# Patient Record
Sex: Female | Born: 1940 | Race: White | Hispanic: No | Marital: Married | Smoking: Never smoker
Health system: Southern US, Community
[De-identification: ages and names within clinical notes are randomized; demographics above are authoritative.]

## PROBLEM LIST (undated history)

## (undated) DIAGNOSIS — E119 Type 2 diabetes mellitus without complications: Secondary | ICD-10-CM

## (undated) DIAGNOSIS — I10 Essential (primary) hypertension: Secondary | ICD-10-CM

---

## 2004-04-15 ENCOUNTER — Other Ambulatory Visit: Payer: Self-pay

## 2004-12-21 ENCOUNTER — Ambulatory Visit: Payer: Self-pay | Admitting: General Practice

## 2005-01-01 ENCOUNTER — Ambulatory Visit: Payer: Self-pay | Admitting: Unknown Physician Specialty

## 2005-01-06 ENCOUNTER — Ambulatory Visit: Payer: Self-pay | Admitting: Unknown Physician Specialty

## 2005-04-06 ENCOUNTER — Emergency Department: Payer: Self-pay | Admitting: Emergency Medicine

## 2005-06-14 ENCOUNTER — Encounter: Payer: Self-pay | Admitting: Unknown Physician Specialty

## 2005-07-02 ENCOUNTER — Encounter: Payer: Self-pay | Admitting: Unknown Physician Specialty

## 2005-07-14 ENCOUNTER — Ambulatory Visit: Payer: Self-pay | Admitting: Unknown Physician Specialty

## 2005-08-01 ENCOUNTER — Ambulatory Visit: Payer: Self-pay | Admitting: Unknown Physician Specialty

## 2005-08-01 ENCOUNTER — Encounter: Payer: Self-pay | Admitting: Unknown Physician Specialty

## 2005-09-01 ENCOUNTER — Encounter: Payer: Self-pay | Admitting: Unknown Physician Specialty

## 2005-10-01 ENCOUNTER — Encounter: Payer: Self-pay | Admitting: Unknown Physician Specialty

## 2005-11-01 ENCOUNTER — Encounter: Payer: Self-pay | Admitting: Unknown Physician Specialty

## 2005-12-02 ENCOUNTER — Encounter: Payer: Self-pay | Admitting: Unknown Physician Specialty

## 2006-01-12 ENCOUNTER — Ambulatory Visit: Payer: Self-pay

## 2006-04-04 ENCOUNTER — Encounter: Payer: Self-pay | Admitting: Podiatry

## 2006-05-01 ENCOUNTER — Encounter: Payer: Self-pay | Admitting: Podiatry

## 2006-05-22 ENCOUNTER — Emergency Department: Payer: Self-pay | Admitting: Emergency Medicine

## 2007-03-30 ENCOUNTER — Ambulatory Visit: Payer: Self-pay

## 2008-06-18 ENCOUNTER — Ambulatory Visit: Payer: Self-pay | Admitting: Unknown Physician Specialty

## 2009-09-16 ENCOUNTER — Ambulatory Visit: Payer: Self-pay

## 2009-10-16 ENCOUNTER — Ambulatory Visit: Payer: Self-pay | Admitting: Unknown Physician Specialty

## 2011-04-13 ENCOUNTER — Ambulatory Visit: Payer: Self-pay

## 2011-09-30 ENCOUNTER — Ambulatory Visit: Payer: Self-pay | Admitting: Surgery

## 2011-10-19 ENCOUNTER — Ambulatory Visit: Payer: Self-pay | Admitting: Unknown Physician Specialty

## 2012-01-25 ENCOUNTER — Ambulatory Visit: Payer: Self-pay | Admitting: Unknown Physician Specialty

## 2012-07-04 ENCOUNTER — Ambulatory Visit: Payer: Self-pay | Admitting: Orthopedic Surgery

## 2012-08-28 ENCOUNTER — Ambulatory Visit: Payer: Self-pay | Admitting: Unknown Physician Specialty

## 2012-08-31 ENCOUNTER — Ambulatory Visit: Payer: Self-pay | Admitting: Orthopedic Surgery

## 2012-08-31 LAB — BASIC METABOLIC PANEL
Anion Gap: 7 (ref 7–16)
Calcium, Total: 9.4 mg/dL (ref 8.5–10.1)
Co2: 30 mmol/L (ref 21–32)
EGFR (African American): >60
Potassium: 3.9 mmol/L (ref 3.5–5.1)

## 2012-08-31 LAB — CBC
HCT: 34.2 % — ABNORMAL LOW (ref 35.0–47.0)
HGB: 11.4 g/dL — ABNORMAL LOW (ref 12.0–16.0)
RDW: 13.8 % (ref 11.5–14.5)
WBC: 8.3 10*3/uL (ref 3.6–11.0)

## 2012-08-31 LAB — MRSA PCR SCREENING

## 2012-08-31 LAB — PROTIME-INR: INR: 0.9

## 2012-08-31 LAB — SEDIMENTATION RATE: Erythrocyte Sed Rate: 21 mm/hr (ref 0–30)

## 2012-10-03 ENCOUNTER — Inpatient Hospital Stay: Payer: Self-pay | Admitting: Orthopedic Surgery

## 2012-10-04 LAB — HEMOGLOBIN: HGB: 9.5 g/dL — ABNORMAL LOW (ref 12.0–16.0)

## 2012-10-04 LAB — BASIC METABOLIC PANEL
Co2: 25 mmol/L (ref 21–32)
EGFR (African American): >60
EGFR (Non-African Amer.): >60
Glucose: 145 mg/dL — ABNORMAL HIGH (ref 65–99)
Potassium: 3.9 mmol/L (ref 3.5–5.1)
Sodium: 140 mmol/L (ref 136–145)

## 2012-10-18 ENCOUNTER — Encounter: Payer: Self-pay | Admitting: Orthopedic Surgery

## 2012-11-01 ENCOUNTER — Encounter: Payer: Self-pay | Admitting: Orthopedic Surgery

## 2012-12-02 ENCOUNTER — Encounter: Payer: Self-pay | Admitting: Orthopedic Surgery

## 2012-12-30 ENCOUNTER — Encounter: Payer: Self-pay | Admitting: Orthopedic Surgery

## 2013-01-28 IMAGING — CR DG KNEE 1-2V*L*
1 series · 2 of 2 positions shown · non-contrast
Comparison: none

REASON FOR EXAM: LEFT TKR
COMMENTS:

[Series 1: ap · 0.17mm/px · 2 of 2 slices shown]
[im 1/2]
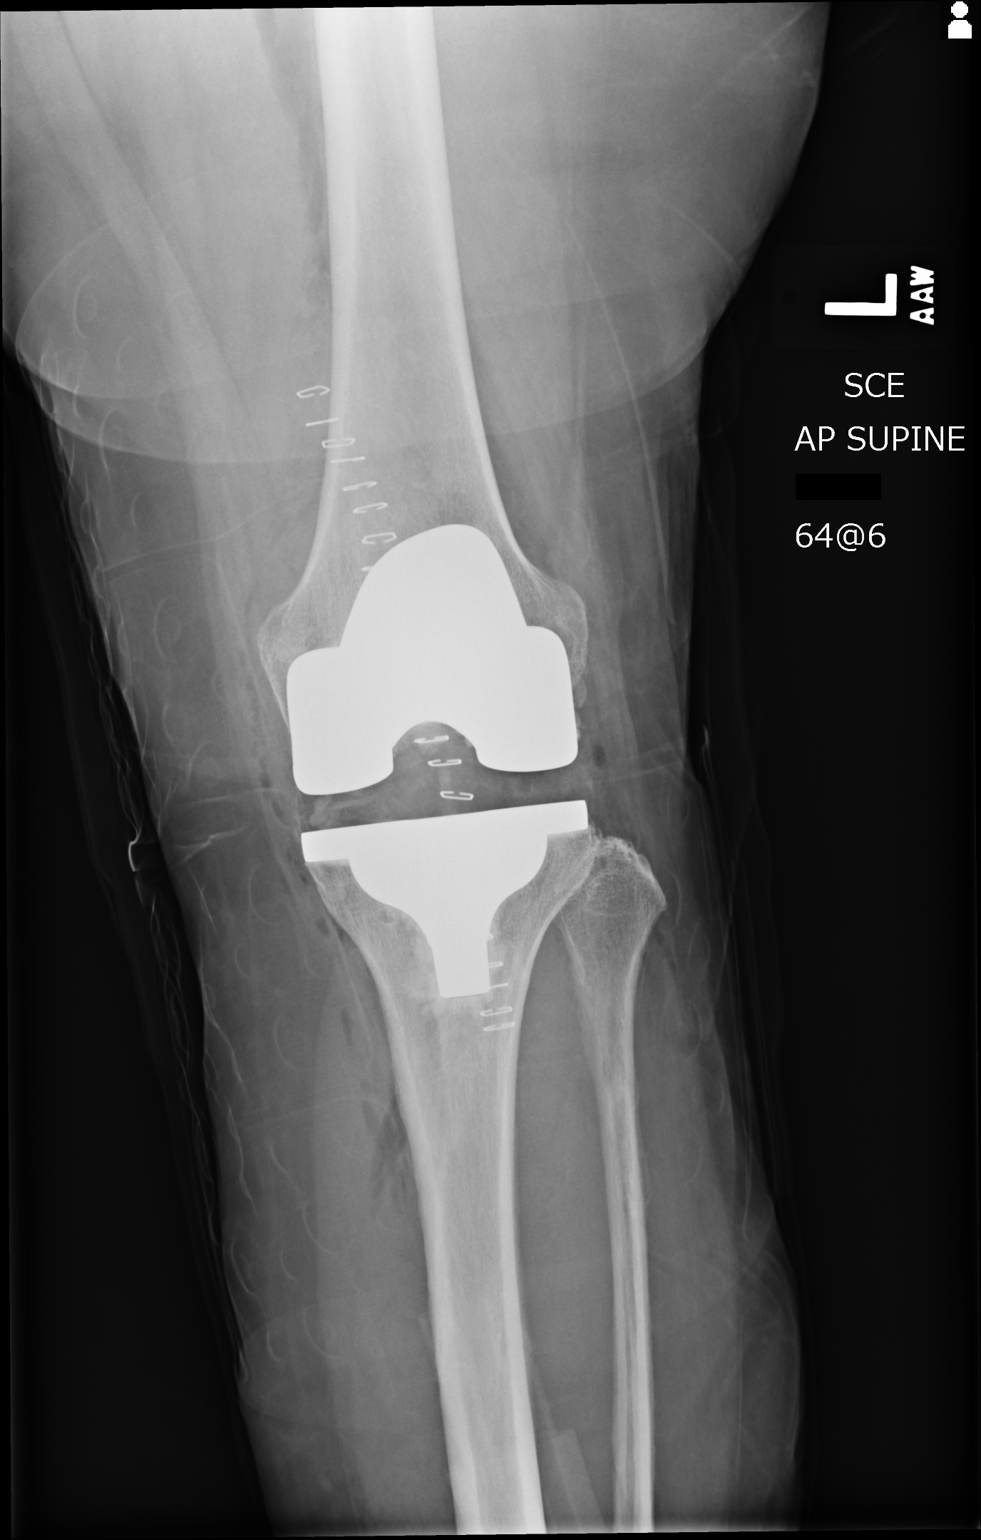
[im 2/2]
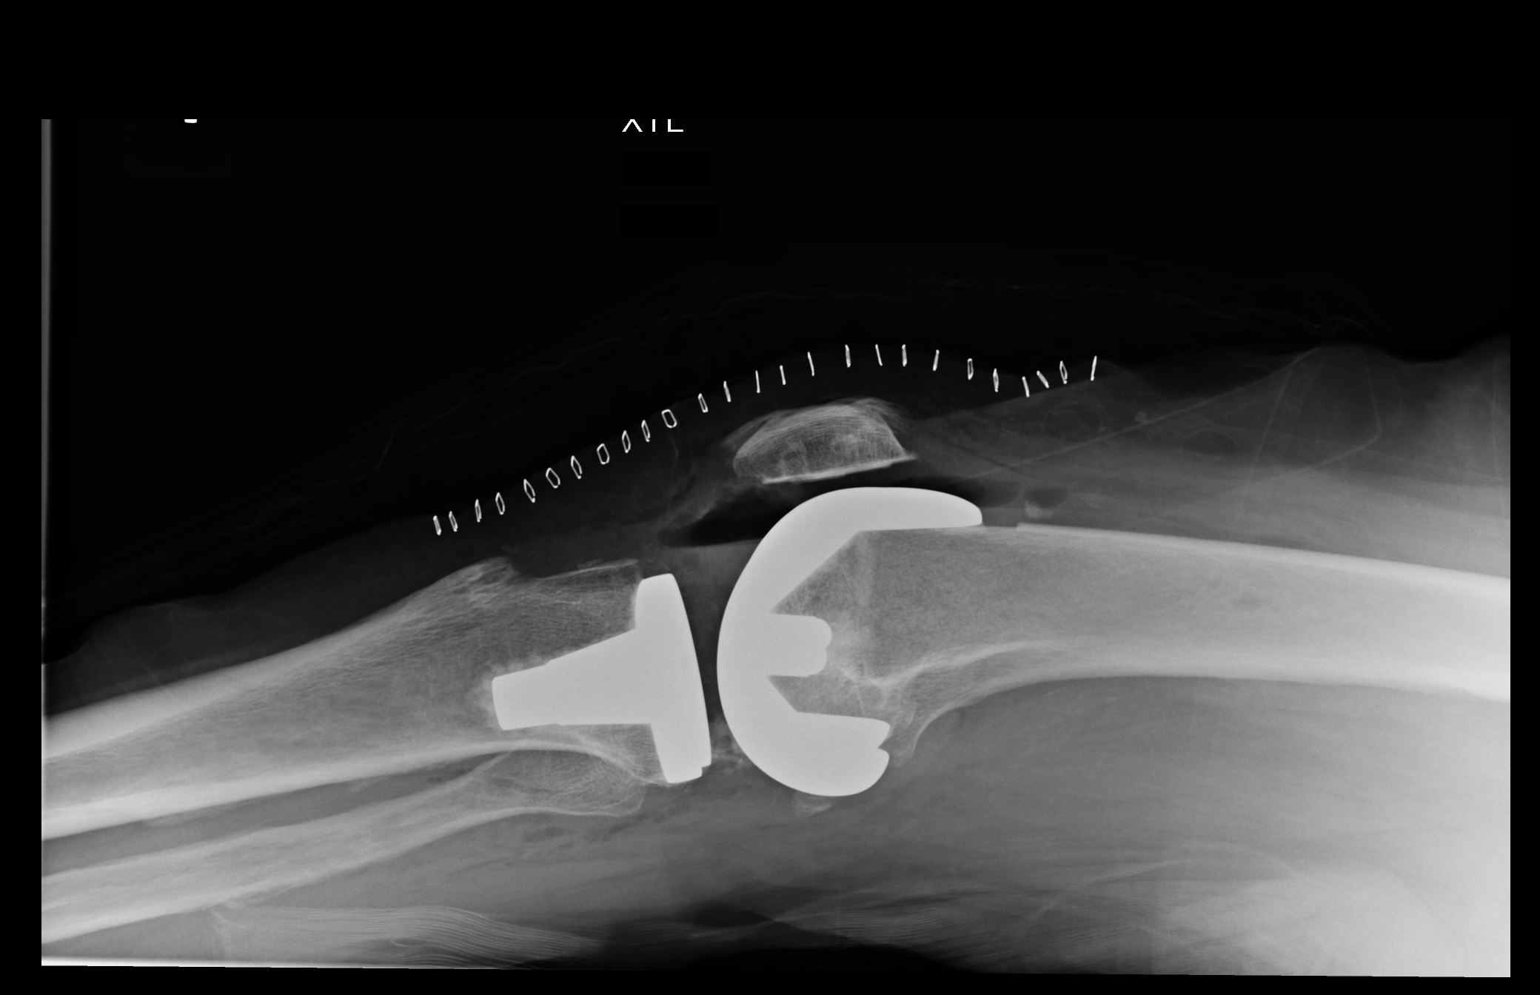

[2 of 2 positions shown; findings below may reference images not displayed]

PROCEDURE:     DXR - DXR KNEE LEFT AP AND LATERAL  - October 03, 2012 [DATE]

RESULT:     AP and lateral views of the left knee reveal the presence of a
total knee joint prosthesis. Radiographic positioning of the prosthetic
components is good. Skin staples are present. There is gas within the soft
tissues.
IMPRESSION: The patient has undergone left total knee joint prosthesis
placement. Further interpretation is deferred to Dr. Eilzer.

[REDACTED]

## 2014-06-05 ENCOUNTER — Emergency Department: Payer: Self-pay | Admitting: Emergency Medicine

## 2014-07-31 DIAGNOSIS — I10 Essential (primary) hypertension: Secondary | ICD-10-CM | POA: Insufficient documentation

## 2014-09-04 ENCOUNTER — Ambulatory Visit: Payer: Self-pay | Admitting: Internal Medicine

## 2014-11-26 DIAGNOSIS — N183 Chronic kidney disease, stage 3 unspecified: Secondary | ICD-10-CM | POA: Insufficient documentation

## 2015-02-18 NOTE — Discharge Summary (Signed)
PATIENT NAME:  Sheena [name], Sheena [name] MR#:  [id number] DATE OF BIRTH:  02-13-1941  DATE OF ADMISSION:  10/03/2012 DATE OF DISCHARGE:  10/07/2012  ADMITTING DIAGNOSIS: Left knee osteoarthritis.   DISCHARGE DIAGNOSIS: Left knee osteoarthritis.   PROCEDURE: Left total knee replacement.   ANESTHESIA: Spinal.   SURGEON: Laurene Footman, M.D.   ASSISTANT: Dorise Hiss, PA-C   IMPLANTS: Medacta left 3 narrow standard femoral component, GMK primary system with a size 2 left fixed tibial component, size 2 14 mm UC tibial insert, and a size 2 resurfacing patella. All components were cemented with antibiotic cement.   ESTIMATED BLOOD LOSS: 50 mL.   COMPLICATIONS: None.   SPECIMEN: Cut ends of bone.   TOURNIQUET TIME: 57 minutes at 300 mmHg.   HISTORY: The patient had increased pain in the left knee that has continued to bother her. If she lays on her side it will awake her. She has more days than none that her pain is debilitating. Whenever she walks on stairs she is quite a bit of pain and in general affects her ability to activities of daily living and care for herself. Despite pain she has continued to work.   PHYSICAL EXAMINATION: On exam she has patellofemoral crepitation as well as medial joint line tenderness. 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 She has intact pulses. Her skin to the knee is normal. She has no pain with hip rotation. There is mild effusion present. Lungs are clear to auscultation. Heart regular rate and rhythm. HEENT is normal.    HOSPITAL COURSE: Patient was admitted to the hospital on 10/03/2012. She had surgery that same day and was brought to the orthopedic floor from the PAC-U in stable condition. On postoperative day one patient's vitals and labs were monitored and her labs remained stable. Throughout her stay she had slow progress with physical therapy but by postoperative day three she did improve dramatically to 90 feet from 15 feet and  by postoperative day four she had increased to 200 feet and was stable and ready for discharge home with home physical therapy.   CONDITION AT DISCHARGE: Stable.   DISCHARGE INSTRUCTIONS:  1. Patient may gradually increase weight-bearing on the affected extremity. She is to elevate the affected foot and leg on 1 or 2 pillows with the foot higher than the knee. She is to wear thigh-high TED hose on both legs and remove at bedtime. Replace when arising the next morning.  2. Diet: She may resume a regular diet. 3. Medications: Lovenox 40 mg subcutaneous once a day for 10. Resume typical home medications.  4. Pain Medications: Oxycodone 5 to 10 mg every four hours as needed for pain, Nucynta 50 to 150 mg every 4 to 6 hours as needed for pain.  5. Wound care: She needs to continue using Polar Care unit maintaining temperature between 40 and 50 degrees. Do not get the dressing or bandage wet or dirty. Call [facility] orthopedics if the dressing gets water under it. Leave the dressing on.  6. Symptoms to report: Call [facility] orthopedics if any of the following occur: Bright red bleeding from the incision or wound, fever above 101.5 degrees, redness, swelling, or drainage at the incision. Call [facility] orthopedics if you experience any increased leg pain, numbness or weakness in the legs or bowel or bladder symptoms.   REFERRALS: Patient is referred to home physical therapy. She needs to call [facility] orthopedics if they have not  contacted her within 48 hours of her return home.   FOLLOW-UP APPOINTMENT: She has a follow-up appointment at [place] in two weeks. She needs to call and schedule an appointment.   DISCHARGE MEDICATIONS:  1. Calcium 600 + D 1 tablet oral once a day in the morning.  2. Fish oil 1000 mg oral capsule one cap orally once a day in the morning.  3. Children's multivitamins 2 tabs orally once a day in the morning. 4. HCTZ valsartan 12.5  mg/160 mg oral tablet 1 tablet orally once a day in the morning.  5. Ferrous sulfate 325 mg 1 tablet orally once a day with lunch.  6. Levothyroxine 88 mcg oral tablet 1 tablet orally once a day in the morning before eating.  7. Metformin 1000 mg oral tablet 1 tablet orally 2 times a day, stopped 48 hours prior to surgery. 8. Celebrex 200 mg oral capsule 1 capsule orally once a day in the morning.  9. Gabapentin 300 mg oral capsule two caps orally once a day at bedtime.  10. Crestor 5 mg oral tablet 1 tablet orally once a day at bedtime. 11. Bydureon 2 mg subcutaneous injection extended-release one dose of subcutaneous once a week on Tuesday. 12. Nucynta 75 mg oral tablet 1 to 2 tabs orally every four hours as needed for pain. 13. Oxycodone 5 mg oral tablet 1 to 2 tabs orally every four hours as needed for pain.  14. Lovenox 40 mg 0.4 mL injection once a day for 14 days.  ____________________________ Duanne Guess, PA-C tcg:cms D: 10/07/2012 09:25:44 ET T: 10/07/2012 10:51:23 ET JOB#: [id number]  cc: Duanne Guess, PA-C, <Dictator> Duanne Guess PA ELECTRONICALLY SIGNED 10/12/2012 10:00

## 2015-02-18 NOTE — Op Note (Signed)
PATIENT NAME:  Sheena [name], Sheena [name] MR#:  [id number] DATE OF BIRTH:  [birth year]  DATE OF PROCEDURE:  10/03/2012  PREOPERATIVE DIAGNOSIS: Left knee osteoarthritis.   POSTOPERATIVE DIAGNOSIS: Left knee osteoarthritis.   PROCEDURE: Left total knee replacement.   ANESTHESIA: Spinal.   SURGEON: Laurene Footman, MD    ASSISTANT: Dorise Hiss, PA-C   IMPLANTS: Medacta left 3 narrow standard femoral component, GMK primary system with a size 2 left fixed tibial component, size 2 14 mm thick UC tibial insert, and a size 2 resurfacing patella. All components were cemented with antibiotic cement.   DESCRIPTION OF PROCEDURE: The patient was brought to the operating room and after adequate anesthesia was obtained, the left leg was prepped and draped in the usual sterile fashion with a tourniquet applied to the upper thigh and Alvarado legholder utilized. After patient identification and time-out procedures were completed and having been prepped and draped in the usual sterile manner, the leg was exsanguinated with an Esmarch and the tourniquet raised to 300 mmHg. Midline skin incision was made followed by a medial parapatellar arthrotomy. Inspection of the knee revealed eburnated bone in the medial femoral condyle and a portion of the tibial condyle. Lateral compartment had areas of full thickness articular loss with exposed bone over approximately a quarter of its surface and patellofemoral joint showing trochlear degenerative change, minimal patellar degenerative change. There was moderate synovitis as well in the joint. The ACL and infrapatellar fat pad were excised and the proximal tibia exposed for application of the [place] cutting guide. After the tibia cutting block was applied, the proximal tibia cut was carried out after checking alignment and bone resections matched the model. The medial and lateral menisci were excised at this time. The femoral cutting block was then applied and drill holes made after  checking alignment and planned resections. This appeared appropriate and the distal femoral cut was carried out. The 3 cutting block was then applied and did not appear that it would notch. Anterior, posterior, and chamfer cuts were carried out and it did appear appropriate at this point. Trial components were inserted with the tibial rotation based on the initial pins off the tibial cutting guide and a size 2 gave excellent coverage. It was pinned in place, proximal drill hole carried out followed by the V notch cut and trial components inserted. With a 14 mm UC insert there was very good stability in flexion and extension and mid flexion as well as to anterior drawer. This was chosen as final component. Distal femoral drill holes were made at this time as well as the notch cut in the trochlea. The patella was then cut using the cutting guide and after drill holes were made it measured to a size 2 patella. At this point the knee was thoroughly irrigated. The posterior capsule and arthrotomy anteriorly were infiltrated with a total of saline, 10 mg morphine, and 30 mL of 0.25% Sensorcaine with epinephrine to aid in postop analgesia. The tibial component was cemented into place first after placing it in position. Excess cement was removed and the polyethylene insert snapped into place. Next, the femoral component was cemented into place further and excess cement again removed. The knee was held in extension as the patellar button was clamped into place. After the cement had set, the patella was noted to track well with no touch technique. After thoroughly irrigating the joint, the tourniquet was let down and hemostasis checked with electrocautery. The arthrotomy was repaired using a heavy  quill suture followed by 2-0 quill subcutaneously and #1 Vicryl, skin staples, Xeroform, 4 x 4's, Webril, Polar Care device, and Ace wrap. The patient was sent to the recovery room in stable condition.   ESTIMATED BLOOD LOSS: 50  mL.   COMPLICATIONS: None.   SPECIMEN: Cut ends of bone.   TOURNIQUET TIME: 57 minutes at 300 mmHg.   ____________________________ Laurene Footman, MD [id number] D: 10/03/2012 20:24:27 ET T: 10/04/2012 08:50:52 ET JOB#: [id number]  cc: Laurene Footman, MD, <Dictator> Laurene Footman MD ELECTRONICALLY SIGNED 10/04/2012 16:41

## 2015-02-25 DIAGNOSIS — E039 Hypothyroidism, unspecified: Secondary | ICD-10-CM | POA: Insufficient documentation

## 2015-02-25 DIAGNOSIS — E7800 Pure hypercholesterolemia, unspecified: Secondary | ICD-10-CM | POA: Insufficient documentation

## 2015-02-26 ENCOUNTER — Ambulatory Visit
Admit: 2015-02-26 | Disposition: A | Discharge status: Home / Self Care | Payer: Self-pay | Attending: Ophthalmology | Admitting: Ophthalmology

## 2015-09-07 DIAGNOSIS — N183 Chronic kidney disease, stage 3 unspecified: Secondary | ICD-10-CM | POA: Insufficient documentation

## 2015-11-24 DIAGNOSIS — E1122 Type 2 diabetes mellitus with diabetic chronic kidney disease: Secondary | ICD-10-CM | POA: Diagnosis not present

## 2015-11-24 DIAGNOSIS — N183 Chronic kidney disease, stage 3 (moderate): Secondary | ICD-10-CM | POA: Diagnosis not present

## 2015-11-24 DIAGNOSIS — E039 Hypothyroidism, unspecified: Secondary | ICD-10-CM | POA: Diagnosis not present

## 2015-12-01 DIAGNOSIS — I10 Essential (primary) hypertension: Secondary | ICD-10-CM | POA: Diagnosis not present

## 2015-12-01 DIAGNOSIS — N182 Chronic kidney disease, stage 2 (mild): Secondary | ICD-10-CM | POA: Diagnosis not present

## 2015-12-01 DIAGNOSIS — E1122 Type 2 diabetes mellitus with diabetic chronic kidney disease: Secondary | ICD-10-CM | POA: Diagnosis not present

## 2015-12-01 DIAGNOSIS — E7800 Pure hypercholesterolemia, unspecified: Secondary | ICD-10-CM | POA: Diagnosis not present

## 2015-12-01 DIAGNOSIS — J0110 Acute frontal sinusitis, unspecified: Secondary | ICD-10-CM | POA: Diagnosis not present

## 2016-02-23 DIAGNOSIS — E1122 Type 2 diabetes mellitus with diabetic chronic kidney disease: Secondary | ICD-10-CM | POA: Diagnosis not present

## 2016-02-23 DIAGNOSIS — N182 Chronic kidney disease, stage 2 (mild): Secondary | ICD-10-CM | POA: Diagnosis not present

## 2016-03-01 DIAGNOSIS — L600 Ingrowing nail: Secondary | ICD-10-CM | POA: Diagnosis not present

## 2016-03-01 DIAGNOSIS — E1122 Type 2 diabetes mellitus with diabetic chronic kidney disease: Secondary | ICD-10-CM | POA: Diagnosis not present

## 2016-03-01 DIAGNOSIS — E039 Hypothyroidism, unspecified: Secondary | ICD-10-CM | POA: Diagnosis not present

## 2016-03-01 DIAGNOSIS — I10 Essential (primary) hypertension: Secondary | ICD-10-CM | POA: Diagnosis not present

## 2016-03-01 DIAGNOSIS — N183 Chronic kidney disease, stage 3 (moderate): Secondary | ICD-10-CM | POA: Diagnosis not present

## 2016-03-01 DIAGNOSIS — Z6835 Body mass index (BMI) 35.0-35.9, adult: Secondary | ICD-10-CM | POA: Diagnosis not present

## 2016-03-05 DIAGNOSIS — L03031 Cellulitis of right toe: Secondary | ICD-10-CM | POA: Diagnosis not present

## 2016-03-23 DIAGNOSIS — L03031 Cellulitis of right toe: Secondary | ICD-10-CM | POA: Diagnosis not present

## 2016-04-08 DIAGNOSIS — L03031 Cellulitis of right toe: Secondary | ICD-10-CM | POA: Diagnosis not present

## 2016-05-26 DIAGNOSIS — E1122 Type 2 diabetes mellitus with diabetic chronic kidney disease: Secondary | ICD-10-CM | POA: Diagnosis not present

## 2016-05-26 DIAGNOSIS — N183 Chronic kidney disease, stage 3 (moderate): Secondary | ICD-10-CM | POA: Diagnosis not present

## 2016-06-02 ENCOUNTER — Other Ambulatory Visit: Payer: Self-pay | Admitting: Internal Medicine

## 2016-06-02 DIAGNOSIS — Z1239 Encounter for other screening for malignant neoplasm of breast: Secondary | ICD-10-CM | POA: Diagnosis not present

## 2016-06-02 DIAGNOSIS — E1122 Type 2 diabetes mellitus with diabetic chronic kidney disease: Secondary | ICD-10-CM | POA: Diagnosis not present

## 2016-06-02 DIAGNOSIS — E039 Hypothyroidism, unspecified: Secondary | ICD-10-CM | POA: Diagnosis not present

## 2016-06-02 DIAGNOSIS — I10 Essential (primary) hypertension: Secondary | ICD-10-CM | POA: Diagnosis not present

## 2016-06-02 DIAGNOSIS — N183 Chronic kidney disease, stage 3 (moderate): Secondary | ICD-10-CM | POA: Diagnosis not present

## 2016-06-23 ENCOUNTER — Ambulatory Visit
Admission: RE | Admit: 2016-06-23 | Discharge: 2016-06-23 | Disposition: A | Discharge status: Home / Self Care | Payer: PPO | Source: Ambulatory Visit | Attending: Internal Medicine | Admitting: Internal Medicine

## 2016-06-23 ENCOUNTER — Other Ambulatory Visit: Payer: Self-pay | Admitting: Internal Medicine

## 2016-06-23 DIAGNOSIS — Z1239 Encounter for other screening for malignant neoplasm of breast: Secondary | ICD-10-CM | POA: Insufficient documentation

## 2016-06-23 DIAGNOSIS — Z1231 Encounter for screening mammogram for malignant neoplasm of breast: Secondary | ICD-10-CM | POA: Insufficient documentation

## 2016-08-26 DIAGNOSIS — E039 Hypothyroidism, unspecified: Secondary | ICD-10-CM | POA: Diagnosis not present

## 2016-08-26 DIAGNOSIS — E1122 Type 2 diabetes mellitus with diabetic chronic kidney disease: Secondary | ICD-10-CM | POA: Diagnosis not present

## 2016-08-26 DIAGNOSIS — N183 Chronic kidney disease, stage 3 (moderate): Secondary | ICD-10-CM | POA: Diagnosis not present

## 2016-09-02 DIAGNOSIS — Z0000 Encounter for general adult medical examination without abnormal findings: Secondary | ICD-10-CM | POA: Diagnosis not present

## 2016-09-02 DIAGNOSIS — Z1211 Encounter for screening for malignant neoplasm of colon: Secondary | ICD-10-CM | POA: Diagnosis not present

## 2016-09-02 DIAGNOSIS — Z780 Asymptomatic menopausal state: Secondary | ICD-10-CM | POA: Diagnosis not present

## 2016-09-08 DIAGNOSIS — Z780 Asymptomatic menopausal state: Secondary | ICD-10-CM | POA: Diagnosis not present

## 2016-12-03 DIAGNOSIS — I10 Essential (primary) hypertension: Secondary | ICD-10-CM | POA: Diagnosis not present

## 2016-12-03 DIAGNOSIS — N183 Chronic kidney disease, stage 3 (moderate): Secondary | ICD-10-CM | POA: Diagnosis not present

## 2016-12-03 DIAGNOSIS — M159 Polyosteoarthritis, unspecified: Secondary | ICD-10-CM | POA: Insufficient documentation

## 2016-12-03 DIAGNOSIS — M150 Primary generalized (osteo)arthritis: Secondary | ICD-10-CM | POA: Diagnosis not present

## 2016-12-03 DIAGNOSIS — E1122 Type 2 diabetes mellitus with diabetic chronic kidney disease: Secondary | ICD-10-CM | POA: Diagnosis not present

## 2016-12-03 DIAGNOSIS — E039 Hypothyroidism, unspecified: Secondary | ICD-10-CM | POA: Diagnosis not present

## 2017-02-25 DIAGNOSIS — N183 Chronic kidney disease, stage 3 (moderate): Secondary | ICD-10-CM | POA: Diagnosis not present

## 2017-02-25 DIAGNOSIS — E1122 Type 2 diabetes mellitus with diabetic chronic kidney disease: Secondary | ICD-10-CM | POA: Diagnosis not present

## 2017-03-04 DIAGNOSIS — N183 Chronic kidney disease, stage 3 (moderate): Secondary | ICD-10-CM | POA: Diagnosis not present

## 2017-03-04 DIAGNOSIS — E538 Deficiency of other specified B group vitamins: Secondary | ICD-10-CM | POA: Diagnosis not present

## 2017-03-04 DIAGNOSIS — E039 Hypothyroidism, unspecified: Secondary | ICD-10-CM | POA: Diagnosis not present

## 2017-03-04 DIAGNOSIS — D649 Anemia, unspecified: Secondary | ICD-10-CM | POA: Diagnosis not present

## 2017-03-04 DIAGNOSIS — L989 Disorder of the skin and subcutaneous tissue, unspecified: Secondary | ICD-10-CM | POA: Diagnosis not present

## 2017-03-04 DIAGNOSIS — E1122 Type 2 diabetes mellitus with diabetic chronic kidney disease: Secondary | ICD-10-CM | POA: Diagnosis not present

## 2017-03-04 DIAGNOSIS — I10 Essential (primary) hypertension: Secondary | ICD-10-CM | POA: Diagnosis not present

## 2017-03-10 ENCOUNTER — Other Ambulatory Visit: Payer: Self-pay | Admitting: Internal Medicine

## 2017-03-10 DIAGNOSIS — N183 Chronic kidney disease, stage 3 unspecified: Secondary | ICD-10-CM

## 2017-03-17 ENCOUNTER — Ambulatory Visit
Admission: RE | Admit: 2017-03-17 | Discharge: 2017-03-17 | Disposition: A | Discharge status: Home / Self Care | Payer: PPO | Source: Ambulatory Visit | Attending: Internal Medicine | Admitting: Internal Medicine

## 2017-03-17 DIAGNOSIS — N189 Chronic kidney disease, unspecified: Secondary | ICD-10-CM | POA: Diagnosis not present

## 2017-03-17 DIAGNOSIS — N183 Chronic kidney disease, stage 3 unspecified: Secondary | ICD-10-CM

## 2017-03-25 DIAGNOSIS — D649 Anemia, unspecified: Secondary | ICD-10-CM | POA: Diagnosis not present

## 2017-06-06 DIAGNOSIS — N183 Chronic kidney disease, stage 3 (moderate): Secondary | ICD-10-CM | POA: Diagnosis not present

## 2017-06-06 DIAGNOSIS — E538 Deficiency of other specified B group vitamins: Secondary | ICD-10-CM | POA: Diagnosis not present

## 2017-06-06 DIAGNOSIS — D649 Anemia, unspecified: Secondary | ICD-10-CM | POA: Diagnosis not present

## 2017-06-06 DIAGNOSIS — E039 Hypothyroidism, unspecified: Secondary | ICD-10-CM | POA: Diagnosis not present

## 2017-06-06 DIAGNOSIS — E1122 Type 2 diabetes mellitus with diabetic chronic kidney disease: Secondary | ICD-10-CM | POA: Diagnosis not present

## 2017-06-13 DIAGNOSIS — E039 Hypothyroidism, unspecified: Secondary | ICD-10-CM | POA: Diagnosis not present

## 2017-06-13 DIAGNOSIS — N183 Chronic kidney disease, stage 3 (moderate): Secondary | ICD-10-CM | POA: Diagnosis not present

## 2017-06-13 DIAGNOSIS — Z0000 Encounter for general adult medical examination without abnormal findings: Secondary | ICD-10-CM | POA: Diagnosis not present

## 2017-06-13 DIAGNOSIS — D649 Anemia, unspecified: Secondary | ICD-10-CM | POA: Diagnosis not present

## 2017-06-13 DIAGNOSIS — E1122 Type 2 diabetes mellitus with diabetic chronic kidney disease: Secondary | ICD-10-CM | POA: Diagnosis not present

## 2017-06-13 DIAGNOSIS — Z23 Encounter for immunization: Secondary | ICD-10-CM | POA: Diagnosis not present

## 2017-06-13 DIAGNOSIS — I10 Essential (primary) hypertension: Secondary | ICD-10-CM | POA: Diagnosis not present

## 2017-12-06 ENCOUNTER — Other Ambulatory Visit: Payer: Self-pay | Admitting: Internal Medicine

## 2017-12-06 DIAGNOSIS — Z1231 Encounter for screening mammogram for malignant neoplasm of breast: Secondary | ICD-10-CM

## 2017-12-07 DIAGNOSIS — D649 Anemia, unspecified: Secondary | ICD-10-CM | POA: Diagnosis not present

## 2017-12-07 DIAGNOSIS — E1122 Type 2 diabetes mellitus with diabetic chronic kidney disease: Secondary | ICD-10-CM | POA: Diagnosis not present

## 2017-12-07 DIAGNOSIS — N183 Chronic kidney disease, stage 3 (moderate): Secondary | ICD-10-CM | POA: Diagnosis not present

## 2017-12-07 DIAGNOSIS — E039 Hypothyroidism, unspecified: Secondary | ICD-10-CM | POA: Diagnosis not present

## 2017-12-14 DIAGNOSIS — I10 Essential (primary) hypertension: Secondary | ICD-10-CM | POA: Diagnosis not present

## 2017-12-14 DIAGNOSIS — D649 Anemia, unspecified: Secondary | ICD-10-CM | POA: Diagnosis not present

## 2017-12-14 DIAGNOSIS — N183 Chronic kidney disease, stage 3 (moderate): Secondary | ICD-10-CM | POA: Diagnosis not present

## 2017-12-14 DIAGNOSIS — E1122 Type 2 diabetes mellitus with diabetic chronic kidney disease: Secondary | ICD-10-CM | POA: Diagnosis not present

## 2017-12-14 DIAGNOSIS — J00 Acute nasopharyngitis [common cold]: Secondary | ICD-10-CM | POA: Diagnosis not present

## 2017-12-19 ENCOUNTER — Ambulatory Visit
Admission: RE | Admit: 2017-12-19 | Discharge: 2017-12-19 | Disposition: A | Discharge status: Home / Self Care | Payer: PPO | Source: Ambulatory Visit | Attending: Internal Medicine | Admitting: Internal Medicine

## 2017-12-19 DIAGNOSIS — Z1231 Encounter for screening mammogram for malignant neoplasm of breast: Secondary | ICD-10-CM | POA: Insufficient documentation

## 2018-03-08 DIAGNOSIS — N183 Chronic kidney disease, stage 3 (moderate): Secondary | ICD-10-CM | POA: Diagnosis not present

## 2018-03-08 DIAGNOSIS — E1122 Type 2 diabetes mellitus with diabetic chronic kidney disease: Secondary | ICD-10-CM | POA: Diagnosis not present

## 2018-03-08 DIAGNOSIS — D649 Anemia, unspecified: Secondary | ICD-10-CM | POA: Diagnosis not present

## 2018-03-15 DIAGNOSIS — E1122 Type 2 diabetes mellitus with diabetic chronic kidney disease: Secondary | ICD-10-CM | POA: Diagnosis not present

## 2018-03-15 DIAGNOSIS — N183 Chronic kidney disease, stage 3 (moderate): Secondary | ICD-10-CM | POA: Diagnosis not present

## 2018-03-15 DIAGNOSIS — D509 Iron deficiency anemia, unspecified: Secondary | ICD-10-CM | POA: Insufficient documentation

## 2018-03-15 DIAGNOSIS — I10 Essential (primary) hypertension: Secondary | ICD-10-CM | POA: Diagnosis not present

## 2018-03-15 DIAGNOSIS — D508 Other iron deficiency anemias: Secondary | ICD-10-CM | POA: Diagnosis not present

## 2018-06-09 DIAGNOSIS — N183 Chronic kidney disease, stage 3 (moderate): Secondary | ICD-10-CM | POA: Diagnosis not present

## 2018-06-09 DIAGNOSIS — D508 Other iron deficiency anemias: Secondary | ICD-10-CM | POA: Diagnosis not present

## 2018-06-09 DIAGNOSIS — E1122 Type 2 diabetes mellitus with diabetic chronic kidney disease: Secondary | ICD-10-CM | POA: Diagnosis not present

## 2018-06-16 DIAGNOSIS — D508 Other iron deficiency anemias: Secondary | ICD-10-CM | POA: Diagnosis not present

## 2018-06-16 DIAGNOSIS — Z0000 Encounter for general adult medical examination without abnormal findings: Secondary | ICD-10-CM | POA: Diagnosis not present

## 2018-06-16 DIAGNOSIS — N183 Chronic kidney disease, stage 3 (moderate): Secondary | ICD-10-CM | POA: Diagnosis not present

## 2018-06-26 DIAGNOSIS — L299 Pruritus, unspecified: Secondary | ICD-10-CM | POA: Diagnosis not present

## 2018-08-08 DIAGNOSIS — L565 Disseminated superficial actinic porokeratosis (DSAP): Secondary | ICD-10-CM | POA: Diagnosis not present

## 2018-08-08 DIAGNOSIS — D0462 Carcinoma in situ of skin of left upper limb, including shoulder: Secondary | ICD-10-CM | POA: Diagnosis not present

## 2018-08-08 DIAGNOSIS — L570 Actinic keratosis: Secondary | ICD-10-CM | POA: Diagnosis not present

## 2018-09-11 DIAGNOSIS — D508 Other iron deficiency anemias: Secondary | ICD-10-CM | POA: Diagnosis not present

## 2018-09-11 DIAGNOSIS — N183 Chronic kidney disease, stage 3 (moderate): Secondary | ICD-10-CM | POA: Diagnosis not present

## 2018-09-18 DIAGNOSIS — D649 Anemia, unspecified: Secondary | ICD-10-CM | POA: Diagnosis not present

## 2018-09-18 DIAGNOSIS — I10 Essential (primary) hypertension: Secondary | ICD-10-CM | POA: Diagnosis not present

## 2018-09-18 DIAGNOSIS — Z23 Encounter for immunization: Secondary | ICD-10-CM | POA: Diagnosis not present

## 2018-09-18 DIAGNOSIS — N183 Chronic kidney disease, stage 3 (moderate): Secondary | ICD-10-CM | POA: Diagnosis not present

## 2018-09-18 DIAGNOSIS — E1122 Type 2 diabetes mellitus with diabetic chronic kidney disease: Secondary | ICD-10-CM | POA: Diagnosis not present

## 2018-11-08 DIAGNOSIS — L97909 Non-pressure chronic ulcer of unspecified part of unspecified lower leg with unspecified severity: Secondary | ICD-10-CM | POA: Diagnosis not present

## 2018-11-14 ENCOUNTER — Encounter: Payer: PPO | Attending: Physician Assistant | Admitting: Physician Assistant

## 2018-11-14 DIAGNOSIS — E1122 Type 2 diabetes mellitus with diabetic chronic kidney disease: Secondary | ICD-10-CM | POA: Insufficient documentation

## 2018-11-14 DIAGNOSIS — L97822 Non-pressure chronic ulcer of other part of left lower leg with fat layer exposed: Secondary | ICD-10-CM | POA: Insufficient documentation

## 2018-11-14 DIAGNOSIS — N183 Chronic kidney disease, stage 3 (moderate): Secondary | ICD-10-CM | POA: Insufficient documentation

## 2018-11-14 DIAGNOSIS — E039 Hypothyroidism, unspecified: Secondary | ICD-10-CM | POA: Diagnosis not present

## 2018-11-14 DIAGNOSIS — I129 Hypertensive chronic kidney disease with stage 1 through stage 4 chronic kidney disease, or unspecified chronic kidney disease: Secondary | ICD-10-CM | POA: Insufficient documentation

## 2018-11-14 DIAGNOSIS — E11622 Type 2 diabetes mellitus with other skin ulcer: Secondary | ICD-10-CM | POA: Insufficient documentation

## 2018-11-14 DIAGNOSIS — E1140 Type 2 diabetes mellitus with diabetic neuropathy, unspecified: Secondary | ICD-10-CM | POA: Insufficient documentation

## 2018-11-15 DIAGNOSIS — E11621 Type 2 diabetes mellitus with foot ulcer: Secondary | ICD-10-CM | POA: Diagnosis not present

## 2018-11-15 NOTE — Progress Notes (Signed)
FINNLEIGH, MARCHETTI ([id number]) Visit Report for 11/14/2018 Allergy List Details Patient Name: Sheena [name], Sheena A. Date of Service: 11/14/2018 8:45 AM Medical Record Number: [id number] Patient Account Number: [id number] Date of Birth/Sex: [birth year] (78 y.o. Female) Treating RN: Cornell Barman Primary Care Brayn Eckstein: Glendon Axe Other Clinician: Referring Sophiya Morello: Campbell Lerner Treating Ashika Apuzzo/Extender: Melburn Hake, HOYT Weeks in Treatment: 0 Allergies Active Allergies No Known Drug Intolerances Allergy Notes Electronic Signature(s) Signed: 11/14/2018 5:06:25 PM By: Gretta Cool, BSN, RN, CWS, Kim RN, BSN Entered By: Gretta Cool, BSN, RN, CWS, Kim on 11/14/2018 09:05:23 Sheena [name] ([id number]) -------------------------------------------------------------------------------- Arrival Information Details Patient Name: Sheena Robert A. Date of Service: 11/14/2018 8:45 AM Medical Record Number: [id number] Patient Account Number: [id number] Date of Birth/Sex: 12/02/1940 (78 y.o. Female) Treating RN: Montey Hora Primary Care Dijon Kohlman: Glendon Axe Other Clinician: Referring Margurete Guaman: Campbell Lerner Treating Johngabriel Verde/Extender: Melburn Hake, HOYT Weeks in Treatment: 0 Visit Information Patient Arrived: Ambulatory Arrival Time: 08:55 Accompanied By: self Transfer Assistance: None Patient Identification Verified: Yes Secondary Verification Process Completed: Yes Electronic Signature(s) Signed: 11/14/2018 1:47:13 PM By: Lorine Bears RCP, RRT, CHT Entered By: Lorine Bears on 11/14/2018 08:55:59 Annett, Floraine AMarland Kitchen ([id number]) -------------------------------------------------------------------------------- Clinic Level of Care Assessment Details Patient Name: Sheena [name], Sheena A. Date of Service: 11/14/2018 8:45 AM Medical Record Number: [id number] Patient Account Number: [id number] Date of Birth/Sex: 08-06-1941 (77 y.o. Female) Treating RN: Montey Hora Primary Care [place] Fant: Glendon Axe Other Clinician: Referring Makana Feigel: Campbell Lerner Treating Brison Fiumara/Extender: STONE III, HOYT Weeks in Treatment: 0 Clinic Level of Care Assessment Items TOOL 1 Quantity Score []  - Use when EandM and Procedure is performed on INITIAL visit 0 ASSESSMENTS - Nursing Assessment / Reassessment X - General Physical Exam (combine w/ comprehensive assessment (listed just below) when 1 20 performed on new pt. evals) X- 1 25 Comprehensive Assessment (HX, ROS, Risk Assessments, Wounds Hx, etc.) ASSESSMENTS - Wound and Skin Assessment / Reassessment []  - Dermatologic / Skin Assessment (not related to wound area) 0 ASSESSMENTS - Ostomy and/or Continence Assessment and Care []  - Incontinence Assessment and Management 0 []  - 0 Ostomy Care Assessment and Management (repouching, etc.) PROCESS - Coordination of Care X - Simple Patient / Family Education for ongoing care 1 15 []  - 0 Complex (extensive) Patient / Family Education for ongoing care X- 1 10 Staff obtains Programmer, systems, Records, Test Results / Process Orders []  - 0 Staff telephones HHA, Nursing Homes / Clarify orders / etc []  - 0 Routine Transfer to another Facility (non-emergent condition) []  - 0 Routine Hospital Admission (non-emergent condition) X- 1 15 New Admissions / Biomedical engineer / Ordering NPWT, Apligraf, etc. []  - 0 Emergency Hospital Admission (emergent condition) PROCESS - Special Needs []  - Pediatric / Minor Patient Management 0 []  - 0 Isolation Patient Management []  - 0 Hearing / Language / Visual special needs []  - 0 Assessment of Community assistance (transportation, D/C planning, etc.) []  - 0 Additional assistance / Altered mentation []  - 0 Support Surface(s) Assessment (bed, cushion, seat, etc.) Sheena [name], Sheena A. ([id number]) INTERVENTIONS - Miscellaneous []  - External ear exam 0 []  - 0 Patient Transfer (multiple staff / Civil Service fast streamer / Similar  devices) []  - 0 Simple Staple / Suture removal (25 or less) []  - 0 Complex Staple / Suture removal (26 or more) []  - 0 Hypo/Hyperglycemic Management (do not check if billed separately) X- 1 15 Ankle / Brachial Index (ABI) - do not check if billed separately Has the patient been seen at the hospital within the last  three years: Yes Total Score: 100 Level Of Care: New/Established - Level 3 Electronic Signature(s) Signed: 11/14/2018 5:22:36 PM By: Montey Hora Entered By: Montey Hora on 11/14/2018 09:38:12 Sheena [name] ([id number]) -------------------------------------------------------------------------------- Encounter Discharge Information Details Patient Name: Sheena Robert A. Date of Service: 11/14/2018 8:45 AM Medical Record Number: [id number] Patient Account Number: [id number] Date of Birth/Sex: Dec 26, 1940 (78 y.o. Female) Treating RN: Montey Hora Primary Care Naveyah Iacovelli: Glendon Axe Other Clinician: Referring Elodie Panameno: Campbell Lerner Treating Sahalie Beth/Extender: STONE III, HOYT Weeks in Treatment: 0 Encounter Discharge Information Items Post Procedure Vitals Discharge Condition: Stable Temperature (F): 98.2 Ambulatory Status: Ambulatory Pulse (bpm): 83 Discharge Destination: Home Respiratory Rate (breaths/min): 18 Transportation: Private Auto Blood Pressure (mmHg): 140/66 Accompanied By: self Schedule Follow-up Appointment: Yes Clinical Summary of Care: Electronic Signature(s) Signed: 11/14/2018 5:22:36 PM By: Montey Hora Entered By: Montey Hora on 11/14/2018 09:46:58 Sheena [name], Sheena A. ([id number]) -------------------------------------------------------------------------------- Lower Extremity Assessment Details Patient Name: Sheena Robert A. Date of Service: 11/14/2018 8:45 AM Medical Record Number: [id number] Patient Account Number: [id number] Date of Birth/Sex: [birth year] (78 y.o. Female) Treating RN: Cornell Barman Primary Care Suhana Wilner:  Glendon Axe Other Clinician: Referring Sandia Pfund: Campbell Lerner Treating Beniah Magnan/Extender: STONE III, HOYT Weeks in Treatment: 0 Vascular Assessment Pulses: Dorsalis Pedis Palpable: [Left:Yes] [Right:Yes] Posterior Tibial Palpable: [Left:Yes] [Right:Yes] Extremity colors, hair growth, and conditions: Extremity Color: [Left:Normal] [Right:Normal] Hair Growth on Extremity: [Left:No] [Right:No] Temperature of Extremity: [Left:Warm] [Right:Warm] Capillary Refill: [Left:> 3 seconds] [Right:> 3 seconds] Blood Pressure: Brachial: [Left:143] Dorsalis Pedis: 160 [Left:Dorsalis Pedis: 145] Ankle: Posterior Tibial: 120 [Left:Posterior Tibial: 178 1.12] [Right:1.24] Toe Nail Assessment Left: Right: Thick: Yes Yes Discolored: No Yes Deformed: No No Improper Length and Hygiene: No No Notes Great right toenail bruised from accident. Electronic Signature(s) Signed: 11/14/2018 5:06:25 PM By: Gretta Cool, BSN, RN, CWS, Kim RN, BSN Entered By: Gretta Cool, BSN, RN, CWS, Kim on 11/14/2018 09:24:43 Sheena [name] ([id number]) -------------------------------------------------------------------------------- Multi Wound Chart Details Patient Name: Sheena Robert A. Date of Service: 11/14/2018 8:45 AM Medical Record Number: [id number] Patient Account Number: [id number] Date of Birth/Sex: 1941-01-11 (78 y.o. Female) Treating RN: Montey Hora Primary Care Kaysin Brock: Glendon Axe Other Clinician: Referring Frazer [place]: Campbell Lerner Treating Kendarrius Tanzi/Extender: STONE III, HOYT Weeks in Treatment: 0 Vital Signs Height(in): 61 Pulse(bpm): 67 Weight(lbs): 201.3 Blood Pressure(mmHg): 140/66 Body Mass Index(BMI): 38 Temperature(F): 98.2 Respiratory Rate 16 (breaths/min): Photos: [1:No Photos] [N/A:N/A] Wound Location: [1:Left Lower Leg - Midline, Anterior] [N/A:N/A] Wounding Event: [1:Surgical Injury] [N/A:N/A] Primary Etiology: [1:Diabetic Wound/Ulcer of the Lower Extremity]  [N/A:N/A] Comorbid History: [1:Cataracts, Anemia, Hypertension, Type II Diabetes, End Stage Renal Disease, Neuropathy] [N/A:N/A] Date Acquired: [1:08/01/2018] [N/A:N/A] Weeks of Treatment: [1:0] [N/A:N/A] Wound Status: [1:Open] [N/A:N/A] Measurements L x W x D [1:1.4x1.5x0.3] [N/A:N/A] (cm) Area (cm) : [1:1.649] [N/A:N/A] Volume (cm) : [1:0.495] [N/A:N/A] Classification: [1:Grade 2] [N/A:N/A] Exudate Amount: [1:Medium] [N/A:N/A] Exudate Type: [1:Serous] [N/A:N/A] Exudate Color: [1:amber] [N/A:N/A] Wound Margin: [1:Thickened] [N/A:N/A] Granulation Amount: [1:Small (1-33%)] [N/A:N/A] Granulation Quality: [1:Red, Hyper-granulation] [N/A:N/A] Necrotic Amount: [1:Large (67-100%)] [N/A:N/A] Exposed Structures: [1:Fat Layer (Subcutaneous Tissue) Exposed: Yes Fascia: No Tendon: No Muscle: No Joint: No Bone: No] [N/A:N/A] Epithelialization: [1:None] [N/A:N/A] Periwound Skin Texture: [1:Excoriation: No Induration: No Callus: No Crepitus: No] [N/A:N/A] Rash: No Scarring: No Periwound Skin Moisture: Maceration: No N/A N/A Dry/Scaly: No Periwound Skin Color: Erythema: Yes N/A N/A Atrophie Blanche: No Cyanosis: No Ecchymosis: No Hemosiderin Staining: No Mottled: No Pallor: No Rubor: No Erythema Location: Circumferential N/A N/A Temperature: No Abnormality N/A N/A Tenderness on Palpation: Yes N/A N/A Wound Preparation: Ulcer Cleansing: N/A  N/A Rinsed/Irrigated with Saline Topical Anesthetic Applied: Other: lidocaine 4% Treatment Notes Electronic Signature(s) Signed: 11/14/2018 5:22:36 PM By: Montey Hora Entered By: Montey Hora on 11/14/2018 09:35:27 Sheena [name] ([id number]) -------------------------------------------------------------------------------- Multi-Disciplinary Care Plan Details Patient Name: Sheena Robert A. Date of Service: 11/14/2018 8:45 AM Medical Record Number: [id number] Patient Account Number: [id number] Date of Birth/Sex: [birth year] (78 y.o.  Female) Treating RN: Montey Hora Primary Care Carvel Huskins: Glendon Axe Other Clinician: Referring Clarity Ciszek: Campbell Lerner Treating Lonette Stevison/Extender: Melburn Hake, HOYT Weeks in Treatment: 0 Active Inactive Nutrition Nursing Diagnoses: Imbalanced nutrition Goals: Patient/caregiver agrees to and verbalizes understanding of need to use nutritional supplements and/or vitamins as prescribed Date Initiated: 11/14/2018 Target Resolution Date: 02/03/2019 Goal Status: Active Interventions: Assess patient nutrition upon admission and as needed per policy Notes: Orientation to the Wound Care Program Nursing Diagnoses: Knowledge deficit related to the wound healing center program Goals: Patient/caregiver will verbalize understanding of the [place] Program Date Initiated: 11/14/2018 Target Resolution Date: 02/03/2019 Goal Status: Active Interventions: Provide education on orientation to the wound center Notes: [name], Acute or Chronic Nursing Diagnoses: [name], acute or chronic: actual or potential Goals: Patient will verbalize adequate [name] control and receive [name] control interventions during procedures as needed Date Initiated: 11/14/2018 Target Resolution Date: 02/03/2019 Goal Status: Active Interventions: Complete [name] assessment as per visit requirements Sheena [name], Sheena A. ([id number]) Notes: Wound/Skin Impairment Nursing Diagnoses: Impaired tissue integrity Goals: Ulcer/skin breakdown will heal within 14 weeks Date Initiated: 11/14/2018 Target Resolution Date: 02/03/2019 Goal Status: Active Interventions: Assess patient/caregiver ability to obtain necessary supplies Assess patient/caregiver ability to perform ulcer/skin care regimen upon admission and as needed Assess ulceration(s) every visit Notes: Electronic Signature(s) Signed: 11/14/2018 5:22:36 PM By: Montey Hora Entered By: Montey Hora on 11/14/2018 09:35:13 Sheena [name], Sheena A.  ([id number]) -------------------------------------------------------------------------------- [name] Assessment Details Patient Name: Sheena Robert A. Date of Service: 11/14/2018 8:45 AM Medical Record Number: [id number] Patient Account Number: [id number] Date of Birth/Sex: [birth year] (78 y.o. Female) Treating RN: Montey Hora Primary Care Elyssia Strausser: Glendon Axe Other Clinician: Referring Omaya Nieland: Campbell Lerner Treating Laveda Demedeiros/Extender: STONE III, HOYT Weeks in Treatment: 0 Active Problems Location of [name] Severity and Description of [name] Patient Has Paino Yes Site Locations Rate the [name]. Current [name] Level: 1 Character of [name] Describe the [name]: Burning, Other: stinging [name] Management and Medication Current [name] Management: Electronic Signature(s) Signed: 11/14/2018 1:47:13 PM By: Paulla Fore, RRT, CHT Signed: 11/14/2018 5:22:36 PM By: Montey Hora Entered By: Lorine Bears on 11/14/2018 08:56:54 Sheena [name] ([id number]) -------------------------------------------------------------------------------- Patient/Caregiver Education Details Patient Name: Sheena Robert A. Date of Service: 11/14/2018 8:45 AM Medical Record Number: [id number] Patient Account Number: [id number] Date of Birth/Gender: 18-Sep-1941 (78 y.o. Female) Treating RN: Montey Hora Primary Care Physician: Glendon Axe Other Clinician: Referring Physician: Campbell Lerner Treating Physician/Extender: Melburn Hake, HOYT Weeks in Treatment: 0 Education Assessment Education Provided To: Patient Education Topics Provided Wound/Skin Impairment: Handouts: Other: wound care as ordered Methods: Demonstration, Explain/Verbal Responses: State content correctly Electronic Signature(s) Signed: 11/14/2018 5:22:36 PM By: Montey Hora Entered By: Montey Hora on 11/14/2018 09:38:30 Sheena [name], Sheena A.  ([id number]) -------------------------------------------------------------------------------- Wound Assessment Details Patient Name: Sheena Robert A. Date of Service: 11/14/2018 8:45 AM Medical Record Number: [id number] Patient Account Number: [id number] Date of Birth/Sex: 1940/12/28 (78 y.o. Female) Treating RN: Cornell Barman Primary Care Alick Lecomte: Glendon Axe Other Clinician: Referring Giovanna Kemmerer: Campbell Lerner Treating Auriah Hollings/Extender: STONE III, HOYT Weeks in Treatment: 0 Wound Status Wound Number: 1 Primary Diabetic Wound/Ulcer of the Lower Extremity Etiology: Wound Location: Left  Lower Leg - Midline, Anterior Wound Open Wounding Event: Surgical Injury Status: Date Acquired: 08/01/2018 Comorbid Cataracts, Anemia, Hypertension, Type II Weeks Of Treatment: 0 History: Diabetes, End Stage Renal Disease, Clustered Wound: No Neuropathy Photos Photo Uploaded By: Gretta Cool, BSN, RN, CWS, Kim on 11/14/2018 17:00:14 Wound Measurements Length: (cm) 1.4 Width: (cm) 1.5 Depth: (cm) 0.3 Area: (cm) 1.649 Volume: (cm) 0.495 % Reduction in Area: % Reduction in Volume: Epithelialization: None Tunneling: No Undermining: No Wound Description Classification: Grade 2 Wound Margin: Thickened Exudate Amount: Medium Exudate Type: Serous Exudate Color: amber Foul Odor After Cleansing: No Slough/Fibrino Yes Wound Bed Granulation Amount: Small (1-33%) Exposed Structure Granulation Quality: Red, Hyper-granulation Fascia Exposed: No Necrotic Amount: Large (67-100%) Fat Layer (Subcutaneous Tissue) Exposed: Yes Necrotic Quality: Adherent Slough Tendon Exposed: No Muscle Exposed: No Joint Exposed: No Bone Exposed: No Periwound Skin Texture Sheena [name], Sheena A. ([id number]) Texture Color No Abnormalities Noted: Yes No Abnormalities Noted: No Atrophie Blanche: No Moisture Cyanosis: No No Abnormalities Noted: Yes Ecchymosis: No Erythema: Yes Erythema Location:  Circumferential Hemosiderin Staining: No Mottled: No Pallor: No Rubor: No Temperature / [name] Temperature: No Abnormality Tenderness on Palpation: Yes Wound Preparation Ulcer Cleansing: Rinsed/Irrigated with Saline Topical Anesthetic Applied: Other: lidocaine 4%, Treatment Notes Wound #1 (Left, Midline, Anterior Lower Leg) Notes prisma, bordered foam dressing Electronic Signature(s) Signed: 11/14/2018 5:06:25 PM By: Gretta Cool, BSN, RN, CWS, Kim RN, BSN Entered By: Gretta Cool, BSN, RN, CWS, Kim on 11/14/2018 09:18:02 Sheena [name] ([id number]) -------------------------------------------------------------------------------- Vitals Details Patient Name: Sheena Robert A. Date of Service: 11/14/2018 8:45 AM Medical Record Number: [id number] Patient Account Number: [id number] Date of Birth/Sex: 06/03/1941 (78 y.o. Female) Treating RN: Montey Hora Primary Care Lilith Solana: Glendon Axe Other Clinician: Referring Debria Broecker: Campbell Lerner Treating Kenleigh Toback/Extender: STONE III, HOYT Weeks in Treatment: 0 Vital Signs Time Taken: 08:56 Temperature (F): 98.2 Height (in): 61 Pulse (bpm): 83 Source: Stated Respiratory Rate (breaths/min): 16 Weight (lbs): 201.3 Blood Pressure (mmHg): 140/66 Source: Measured Reference Range: 80 - 120 mg / dl Body Mass Index (BMI): 38 Electronic Signature(s) Signed: 11/14/2018 1:47:13 PM By: Lorine Bears RCP, RRT, CHT Entered By: Lorine Bears on 11/14/2018 09:00:55

## 2018-11-15 NOTE — Progress Notes (Signed)
Sheena, [name] ([id number]) Visit Report for 11/14/2018 Chief Complaint Document Details Patient Name: Sheena [name], Sheena A. Date of Service: 11/14/2018 8:45 AM Medical Record Number: [id number] Patient Account Number: [id number] Date of Birth/Sex: June 01, 1941 (78 y.o. Female) Treating RN: Montey Hora Primary Care Provider: Glendon Axe Other Clinician: Referring Provider: Campbell Lerner Treating Provider/Extender: Melburn Hake, Mazin Emma Weeks in Treatment: 0 Information Obtained from: Patient Chief Complaint Left leg ulcer Electronic Signature(s) Signed: 11/14/2018 5:17:02 PM By: Worthy Keeler PA-C Entered By: Worthy Keeler on 11/14/2018 09:31:31 Wolaver, Hind A. ([id number]) -------------------------------------------------------------------------------- Debridement Details Patient Name: Sheena Robert A. Date of Service: 11/14/2018 8:45 AM Medical Record Number: [id number] Patient Account Number: [id number] Date of Birth/Sex: April 03, 1941 (78 y.o. Female) Treating RN: Montey Hora Primary Care Provider: Glendon Axe Other Clinician: Referring Provider: Campbell Lerner Treating Provider/Extender: STONE III, Dovid Bartko Weeks in Treatment: 0 Debridement Performed for Wound #1 Left,Midline,Anterior Lower Leg Assessment: Performed By: Physician STONE III, Jace Fermin E., PA-C Debridement Type: Debridement Severity of Tissue Pre Fat layer exposed Debridement: Level of Consciousness (Pre- Awake and Alert procedure): Pre-procedure Verification/Time Yes - 09:36 Out Taken: Start Time: 09:36 Pain Control: Lidocaine 4% Topical Solution Total Area Debrided (L x W): 1.4 (cm) x 1.5 (cm) = 2.1 (cm) Tissue and other material Viable, Non-Viable, Slough, Subcutaneous, Biofilm, Slough debrided: Level: Skin/Subcutaneous Tissue Debridement Description: Excisional Instrument: Curette Bleeding: Minimum Hemostasis Achieved: Pressure End Time: 09:39 Procedural Pain: 0 Post Procedural Pain:  0 Response to Treatment: Procedure was tolerated well Level of Consciousness Awake and Alert (Post-procedure): Post Debridement Measurements of Total Wound Length: (cm) 1.4 Width: (cm) 1.5 Depth: (cm) 0.4 Volume: (cm) 0.66 Character of Wound/Ulcer Post Debridement: Improved Severity of Tissue Post Debridement: Fat layer exposed Post Procedure Diagnosis Same as Pre-procedure Electronic Signature(s) Signed: 11/14/2018 5:17:02 PM By: Worthy Keeler PA-C Signed: 11/14/2018 5:22:36 PM By: Montey Hora Entered By: Montey Hora on 11/14/2018 09:39:29 Claud, Nikkol A. ([id number]) -------------------------------------------------------------------------------- HPI Details Patient Name: Sheena Robert A. Date of Service: 11/14/2018 8:45 AM Medical Record Number: [id number] Patient Account Number: [id number] Date of Birth/Sex: 03-25-1941 (78 y.o. Female) Treating RN: Montey Hora Primary Care Provider: Glendon Axe Other Clinician: Referring Provider: Campbell Lerner Treating Provider/Extender: Melburn Hake, Jessalynn Mccowan Weeks in Treatment: 0 History of Present Illness HPI Description: 11/14/18 on evaluation today patient presents for initial evaluation our clinic concerning issues that she is having with her left lower extremity secondary to a biopsy which was performed on August 01, 2018. This area come back as an actinic keratosis and was not cancerous. With that being said she's had a very difficult time getting this we can heal although she states and last month it has been somewhat better than previous. Fortunately there does not appear to be any signs of infection. She did have recommendations from the dermatologist to do vinegar soaks twice a day along with applying Bactroban following. She is a diabetic type II with her most recent reported hemoglobin A1c of 6.7 this is from the patient we did not have record of this. She also has hypertension and chronic kidney disease stage III  along with hypothyroidism. No fevers, chills, nausea, or vomiting noted at this time. Electronic Signature(s) Signed: 11/14/2018 5:17:02 PM By: Worthy Keeler PA-C Entered By: Worthy Keeler on 11/14/2018 09:46:54 Jerline Pain ([id number]) -------------------------------------------------------------------------------- Physical Exam Details Patient Name: Sheena Robert A. Date of Service: 11/14/2018 8:45 AM Medical Record Number: [id number] Patient Account Number: [id number] Date of Birth/Sex: December 25, 1940 (78 y.o. Female) Treating RN: Montey Hora Primary  Care Provider: Glendon Axe Other Clinician: Referring Provider: Campbell Lerner Treating Provider/Extender: STONE III, Ronav Furney Weeks in Treatment: 0 Constitutional patient is hypertensive.. pulse regular and within target range for patient.Marland Kitchen respirations regular, non-labored and within target range for patient.Marland Kitchen temperature within target range for patient.. Well-nourished and well-hydrated in no acute distress. Eyes conjunctiva clear no eyelid edema noted. pupils equal round and reactive to light and accommodation. Ears, Nose, Mouth, and Throat no gross abnormality of ear auricles or external auditory canals. normal hearing noted during conversation. mucus membranes moist. Respiratory normal breathing without difficulty. clear to auscultation bilaterally. Cardiovascular regular rate and rhythm with normal S1, S2. 2+ dorsalis pedis/posterior tibialis pulses. no clubbing, cyanosis, significant edema, <3 sec cap refill. Gastrointestinal (GI) soft, non-tender, non-distended, +BS. no ventral hernia noted. Musculoskeletal normal gait and posture. no significant deformity or arthritic changes, no loss or range of motion, no clubbing. Psychiatric this patient is able to make decisions and demonstrates good insight into disease process. Alert and Oriented x 3. pleasant and cooperative. Notes On evaluation today patient's wound  bed actually shows evidence of fairly good granulation at this point in time. There is some Slough noted on the surface of the wound which did require sharp debridement today. She tolerated this without complication. Post debridement the wound bed seems to be doing significantly better which is great news. Overall I've been I am rather pleased with the appearance today. Electronic Signature(s) Signed: 11/14/2018 5:17:02 PM By: Worthy Keeler PA-C Entered By: Worthy Keeler on 11/14/2018 09:47:42 Jerline Pain ([id number]) -------------------------------------------------------------------------------- Physician Orders Details Patient Name: Sheena Robert A. Date of Service: 11/14/2018 8:45 AM Medical Record Number: [id number] Patient Account Number: [id number] Date of Birth/Sex: [birth year] (78 y.o. Female) Treating RN: Montey Hora Primary Care Provider: Glendon Axe Other Clinician: Referring Provider: Campbell Lerner Treating Provider/Extender: Melburn Hake, Mohit Zirbes Weeks in Treatment: 0 Verbal / Phone Orders: No Diagnosis Coding ICD-10 Coding Code Description E11.622 Type 2 diabetes mellitus with other skin ulcer L97.822 Non-pressure chronic ulcer of other part of left lower leg with fat layer exposed I10 Essential (primary) hypertension N18.3 Chronic kidney disease, stage 3 (moderate) Wound Cleansing Wound #1 Left,Midline,Anterior Lower Leg o Clean wound with Normal Saline. o May Shower, gently pat wound dry prior to applying new dressing. Anesthetic (add to Medication List) Wound #1 Left,Midline,Anterior Lower Leg o Topical Lidocaine 4% cream applied to wound bed prior to debridement (In Clinic Only). Primary Wound Dressing Wound #1 Left,Midline,Anterior Lower Leg o Silver Collagen - moisten with saline Secondary Dressing Wound #1 Left,Midline,Anterior Lower Leg o Boardered Foam Dressing Dressing Change Frequency Wound #1 Left,Midline,Anterior Lower  Leg o Change dressing every other day. Follow-up Appointments Wound #1 Left,Midline,Anterior Lower Leg o Return Appointment in 1 week. Electronic Signature(s) Signed: 11/14/2018 5:17:02 PM By: Worthy Keeler PA-C Signed: 11/14/2018 5:22:36 PM By: Montey Hora Entered By: Montey Hora on 11/14/2018 09:40:23 Jerline Pain ([id number]) -------------------------------------------------------------------------------- Problem List Details Patient Name: Sheena Robert A. Date of Service: 11/14/2018 8:45 AM Medical Record Number: [id number] Patient Account Number: [id number] Date of Birth/Sex: [birth year] (78 y.o. Female) Treating RN: Montey Hora Primary Care Provider: Glendon Axe Other Clinician: Referring Provider: Campbell Lerner Treating Provider/Extender: Melburn Hake, Trenita Hulme Weeks in Treatment: 0 Active Problems ICD-10 Evaluated Encounter Code Description Active Date Today Diagnosis E11.622 Type 2 diabetes mellitus with other skin ulcer 11/14/2018 No Yes L97.822 Non-pressure chronic ulcer of other part of left lower leg with 11/14/2018 No Yes fat layer exposed I10 Essential (primary) hypertension 11/14/2018  No Yes N18.3 Chronic kidney disease, stage 3 (moderate) 11/14/2018 No Yes Inactive Problems Resolved Problems Electronic Signature(s) Signed: 11/14/2018 5:17:02 PM By: Worthy Keeler PA-C Entered By: Worthy Keeler on 11/14/2018 09:30:31 Santori, [place] A. ([id number]) -------------------------------------------------------------------------------- Progress Note Details Patient Name: Sheena Robert A. Date of Service: 11/14/2018 8:45 AM Medical Record Number: [id number] Patient Account Number: [id number] Date of Birth/Sex: Mar 09, 1941 (78 y.o. Female) Treating RN: Montey Hora Primary Care Provider: Glendon Axe Other Clinician: Referring Provider: Campbell Lerner Treating Provider/Extender: Melburn Hake, Yari Szeliga Weeks in Treatment: 0 Subjective Chief  Complaint Information obtained from Patient Left leg ulcer History of Present Illness (HPI) 11/14/18 on evaluation today patient presents for initial evaluation our clinic concerning issues that she is having with her left lower extremity secondary to a biopsy which was performed on August 01, 2018. This area come back as an actinic keratosis and was not cancerous. With that being said she's had a very difficult time getting this we can heal although she states and last month it has been somewhat better than previous. Fortunately there does not appear to be any signs of infection. She did have recommendations from the dermatologist to do vinegar soaks twice a day along with applying Bactroban following. She is a diabetic type II with her most recent reported hemoglobin A1c of 6.7 this is from the patient we did not have record of this. She also has hypertension and chronic kidney disease stage III along with hypothyroidism. No fevers, chills, nausea, or vomiting noted at this time. Wound History Patient presents with 1 open wound that has been present for approximately october. Patient has been treating wound in the following manner: Mupirocin. Laboratory tests have not been performed in the last month. Patient reportedly has not tested positive for an antibiotic resistant organism. Patient reportedly has not tested positive for osteomyelitis. Patient reportedly has not had testing performed to evaluate circulation in the legs. Patient History Information obtained from Patient, Chart. Allergies No Known Drug Intolerances Family History Diabetes - Paternal Grandparents, Hypertension - Father, Stroke - Father, No family history of Cancer, Heart Disease, Hereditary Spherocytosis, Kidney Disease, Lung Disease, Seizures, Thyroid Problems, Tuberculosis. Social History Never smoker, Marital Status - Married, Alcohol Use - Never, Drug Use - No History, Caffeine Use - Moderate. Medical  History Eyes Patient has history of Cataracts - Both removed Denies history of Glaucoma Ear/Nose/Mouth/Throat Denies history of Chronic sinus problems/congestion, Middle ear problems Hematologic/Lymphatic Patient has history of Anemia Denies history of Hemophilia, Human Immunodeficiency Virus, Lymphedema, Sickle Cell Disease Respiratory Koman, Awanda A. ([id number]) Denies history of Aspiration, Asthma, Chronic Obstructive Pulmonary Disease (COPD), Pneumothorax, Sleep Apnea, Tuberculosis Cardiovascular Patient has history of Hypertension - Medication controlled Denies history of Angina, Arrhythmia, Congestive Heart Failure, Coronary Artery Disease, Deep Vein Thrombosis, Hypotension, Myocardial Infarction, Peripheral Arterial Disease, Peripheral Venous Disease, Phlebitis, Vasculitis Gastrointestinal Denies history of Cirrhosis , Colitis, Crohn s, Hepatitis A, Hepatitis B, Hepatitis C Endocrine Patient has history of Type II Diabetes Denies history of Type I Diabetes Genitourinary 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 Blood sugar is not tested. Review of  Systems (ROS) Constitutional Symptoms (General Health) The patient has no complaints or symptoms. Eyes The patient has no complaints or symptoms. Ear/Nose/Mouth/Throat The patient has no complaints or symptoms. Hematologic/Lymphatic The patient has no complaints or  symptoms. Respiratory The patient has no complaints or symptoms. Cardiovascular Complains or has symptoms of LE edema. Denies complaints or symptoms of Chest pain. Gastrointestinal The patient has no complaints or symptoms. Endocrine The patient has no complaints or symptoms. Genitourinary Complains or has symptoms of Incontinence/dribbling. Denies complaints or symptoms of Kidney failure/ Dialysis. Immunological Complains or has symptoms of Itching - Legs. Integumentary (Skin) Complains or has symptoms of Wounds, Bleeding or bruising tendency. Denies complaints or symptoms of Breakdown, Swelling. Musculoskeletal The patient has no complaints or symptoms, Knee replacement Neurologic The patient has no complaints or symptoms. Oncologic JESSICAH, CROLL A. ([id number]) The patient has no complaints or symptoms, Skin Cancer Psychiatric Complains or has symptoms of Claustrophobia. Denies complaints or symptoms of Anxiety. Objective Constitutional patient is hypertensive.. pulse regular and within target range for patient.Marland Kitchen respirations regular, non-labored and within target range for patient.Marland Kitchen temperature within target range for patient.. Well-nourished and well-hydrated in no acute distress. Vitals Time Taken: 8:56 AM, Height: 61 in, Source: Stated, Weight: 201.3 lbs, Source: Measured, BMI: 38, Temperature: 98.2 F, Pulse: 83 bpm, Respiratory Rate: 16 breaths/min, Blood Pressure: 140/66 mmHg. Eyes conjunctiva clear no eyelid edema noted. pupils equal round and reactive to light and accommodation. Ears, Nose, Mouth, and Throat no gross abnormality of ear auricles or external auditory canals. normal hearing noted during conversation. mucus membranes moist. Respiratory normal breathing without difficulty. clear to auscultation bilaterally. Cardiovascular regular rate and rhythm with normal S1, S2. 2+ dorsalis pedis/posterior tibialis pulses. no clubbing, cyanosis,  significant edema, Gastrointestinal (GI) soft, non-tender, non-distended, +BS. no ventral hernia noted. Musculoskeletal normal gait and posture. no significant deformity or arthritic changes, no loss or range of motion, no clubbing. Psychiatric this patient is able to make decisions and demonstrates good insight into disease process. Alert and Oriented x 3. pleasant and cooperative. General Notes: On evaluation today patient's wound bed actually shows evidence of fairly good granulation at this point in time. There is some Slough noted on the surface of the wound which did require sharp debridement today. She tolerated this without complication. Post debridement the wound bed seems to be doing significantly better which is great news. Overall I've been I am rather pleased with the appearance today. Integumentary (Hair, Skin) Wound #1 status is Open. Original cause of wound was Surgical Injury. The wound is located on the Left,Midline,Anterior Lower Leg. The wound measures 1.4cm length x 1.5cm width x 0.3cm depth; 1.649cm^2 area and 0.495cm^3 volume. There is Fat Layer (Subcutaneous Tissue) Exposed exposed. There is no tunneling or undermining noted. There is a medium amount of serous drainage noted. The wound margin is thickened. There is small (1-33%) red, hyper - granulation within the wound bed. There is a large (67-100%) amount of necrotic tissue within the wound bed including Adherent Slough. The periwound skin appearance had no abnormalities noted for texture. The periwound skin appearance had no abnormalities Leinbach, Klee A. ([id number]) noted for moisture. The periwound skin appearance exhibited: Erythema. The periwound skin appearance did not exhibit: Atrophie Blanche, Cyanosis, Ecchymosis, Hemosiderin Staining, Mottled, Pallor, Rubor. The surrounding wound skin color is noted with erythema which is circumferential. Periwound temperature was noted as No Abnormality. The periwound  has tenderness on palpation. Assessment Active Problems ICD-10 Type 2 diabetes mellitus with other skin ulcer  Non-pressure chronic ulcer of other part of left lower leg with fat layer exposed Essential (primary) hypertension Chronic kidney disease, stage 3 (moderate) Procedures Wound #1 Pre-procedure diagnosis of Wound #1 is a Diabetic Wound/Ulcer of the Lower Extremity located on the Left,Midline,Anterior Lower Leg .Severity of Tissue Pre Debridement is: Fat layer exposed. There was a Excisional Skin/Subcutaneous Tissue Debridement with a total area of 2.1 sq cm performed by STONE III, Bijon Mineer E., PA-C. With the following instrument(s): Curette to remove Viable and Non-Viable tissue/material. Material removed includes Subcutaneous Tissue, Slough, and Biofilm after achieving pain control using Lidocaine 4% Topical Solution. No specimens were taken. A time out was conducted at 09:36, prior to the start of the procedure. A Minimum amount of bleeding was controlled with Pressure. The procedure was tolerated well with a pain level of 0 throughout and a pain level of 0 following the procedure. Post Debridement Measurements: 1.4cm length x 1.5cm width x 0.4cm depth; 0.66cm^3 volume. Character of Wound/Ulcer Post Debridement is improved. Severity of Tissue Post Debridement is: Fat layer exposed. Post procedure Diagnosis Wound #1: Same as Pre-Procedure Plan Wound Cleansing: Wound #1 Left,Midline,Anterior Lower Leg: Clean wound with Normal Saline. May Shower, gently pat wound dry prior to applying new dressing. Anesthetic (add to Medication List): Wound #1 Left,Midline,Anterior Lower Leg: Topical Lidocaine 4% cream applied to wound bed prior to debridement (In Clinic Only). Primary Wound Dressing: Wound #1 Left,Midline,Anterior Lower Leg: Silver Collagen - moisten with saline Secondary Dressing: Wound #1 Left,Midline,Anterior Lower Leg: Boardered Foam Dressing Dressing Change  Frequency: Scarpelli, Arnelle A. ([id number]) Wound #1 Left,Midline,Anterior Lower Leg: Change dressing every other day. Follow-up Appointments: Wound #1 Left,Midline,Anterior Lower Leg: Return Appointment in 1 week. I'm gonna suggest currently that we go ahead and continue over the next week with the above wound care measures. The patient is in agreement the plan. With that being said I will have a discontinue use of the vinegar soaks as well as the Bactroban ointment at this point. If anything changes or worsens she will contact the office and let me know otherwise will see her back in one week for reevaluation. The patient is in agreement with plan and tells me that she already has doll antibacterial soap at home which is excellent. Please see above for specific wound care orders. We will see patient for re-evaluation in 1 week(s) here in the clinic. If anything worsens or changes patient will contact our office for additional recommendations. Electronic Signature(s) Signed: 11/14/2018 5:17:02 PM By: Worthy Keeler PA-C Entered By: Worthy Keeler on 11/14/2018 09:48:23 Jerline Pain ([id number]) -------------------------------------------------------------------------------- ROS/PFSH Details Patient Name: Sheena Robert A. Date of Service: 11/14/2018 8:45 AM Medical Record Number: [id number] Patient Account Number: [id number] Date of Birth/Sex: 09/04/1941 (78 y.o. Female) Treating RN: Cornell Barman Primary Care Provider: Glendon Axe Other Clinician: Referring Provider: Campbell Lerner Treating Provider/Extender: Melburn Hake, Jaxan Michel Weeks in Treatment: 0 Information Obtained From Patient Chart Wound History Do you currently have one or more open woundso Yes How many open wounds do you currently haveo 1 Approximately how long have you had your woundso october How have you been treating your wound(s) until nowo Mupirocin Has your wound(s) ever healed and then re-openedo No Have  you had any lab work done in the past montho No Have you tested positive for an antibiotic resistant organism (MRSA, VRE)o No Have you tested positive for osteomyelitis (bone infection)o No Have you had any tests for circulation on your legso No Cardiovascular Complaints and Symptoms:  Positive for: LE edema Negative for: Chest pain Medical History: Positive for: Hypertension - Medication controlled Negative for: Angina; Arrhythmia; Congestive Heart Failure; Coronary Artery Disease; Deep Vein Thrombosis; Hypotension; Myocardial Infarction; Peripheral Arterial Disease; Peripheral Venous Disease; Phlebitis; Vasculitis Genitourinary Complaints and Symptoms: Positive for: Incontinence/dribbling Negative for: Kidney failure/ Dialysis Medical History: Positive for: End Stage Renal Disease - Stage 3 Immunological Complaints and Symptoms: Positive for: Itching - Legs Medical History: Negative for: Lupus Erythematosus; Raynaudos; Scleroderma Integumentary (Skin) Complaints and Symptoms: Positive for: Wounds; Bleeding or bruising tendency Negative for: Breakdown; Swelling Medical HistoryLYDIA, TOREN A. ([id number]) Negative for: History of Burn; History of pressure wounds Psychiatric Complaints and Symptoms: Positive for: Claustrophobia Negative for: Anxiety Medical History: Negative for: Anorexia/bulimia; Confinement Anxiety Constitutional Symptoms (General Health) Complaints and Symptoms: No Complaints or Symptoms Eyes Complaints and Symptoms: No Complaints or Symptoms Medical History: Positive for: Cataracts - Both removed Negative for: Glaucoma Ear/Nose/Mouth/Throat Complaints and Symptoms: No Complaints or Symptoms Medical History: Negative for: Chronic sinus problems/congestion; Middle ear problems Hematologic/Lymphatic Complaints and Symptoms: No Complaints or Symptoms Medical History: Positive for: Anemia Negative for: Hemophilia; Human Immunodeficiency Virus;  Lymphedema; Sickle Cell Disease Respiratory Complaints and Symptoms: No Complaints or Symptoms Medical History: Negative for: Aspiration; Asthma; Chronic Obstructive Pulmonary Disease (COPD); Pneumothorax; Sleep Apnea; Tuberculosis Gastrointestinal Complaints and Symptoms: No Complaints or Symptoms Medical History: Negative for: Cirrhosis ; Colitis; Crohnos; Hepatitis A; Hepatitis B; Hepatitis C Angelucci, Gayathri A. ([id number]) Endocrine Complaints and Symptoms: No Complaints or Symptoms Medical History: Positive for: Type II Diabetes Negative for: Type I Diabetes Time with diabetes: 20 Treated with: Oral agents Blood sugar tested every day: No Musculoskeletal Complaints and Symptoms: No Complaints or Symptoms Complaints and Symptoms: Review of System Notes: Knee replacement Medical History: Negative for: Gout; Rheumatoid Arthritis; Osteoarthritis; Osteomyelitis Neurologic Complaints and Symptoms: No Complaints or Symptoms Medical History: Positive for: Neuropathy Negative for: Dementia; Quadriplegia; Paraplegia; Seizure Disorder Oncologic Complaints and Symptoms: No Complaints or Symptoms Complaints and Symptoms: Review of System Notes: Skin Cancer Medical History: Negative for: Received Chemotherapy; Received Radiation HBO Extended History Items Eyes: Cataracts Immunizations Pneumococcal Vaccine: Received Pneumococcal Vaccination: Yes Implantable Devices Family and Social History Cancer: No; Diabetes: Yes - Paternal Grandparents; Heart Disease: No; Hereditary Spherocytosis: No; Hypertension: Yes - Father; Kidney Disease: No; Lung Disease: No; Seizures: No; Stroke: Yes - Father; Thyroid Problems: No; Tuberculosis: No; Munley, Maliyah A. ([id number]) Never smoker; Marital Status - Married; Alcohol Use: Never; Drug Use: No History; Caffeine Use: Moderate; Advanced Directives: No; Patient does not want information on Advanced Directives; Do not resuscitate: No;  Living Will: No; Medical Power of Attorney: No Electronic Signature(s) Signed: 11/14/2018 5:06:25 PM By: Gretta Cool, BSN, RN, CWS, Kim RN, BSN Signed: 11/14/2018 5:17:02 PM By: Worthy Keeler PA-C Entered By: Gretta Cool, BSN, RN, CWS, Kim on 11/14/2018 09:13:13 Jerline Pain ([id number]) -------------------------------------------------------------------------------- SuperBill Details Patient Name: Sheena Robert A. Date of Service: 11/14/2018 Medical Record Number: [id number] Patient Account Number: [id number] Date of Birth/Sex: 08-09-1941 (78 y.o. 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 I10 Essential (primary) hypertension N18.3 Chronic kidney disease, stage 3 (moderate) Facility Procedures CPT4 Code Description: [id number] 99213 - WOUND CARE VISIT-LEV 3 EST PT Modifier: Quantity: 1 CPT4 Code Description:  [id number] 11042 - DEB SUBQ TISSUE 20 SQ CM/< ICD-10 Diagnosis Description L97.822 Non-pressure chronic ulcer of other part of left lower leg with Modifier: fat layer expos Quantity: 1 ed Physician Procedures CPT4 Code Description: 0100712 WC PHYS LEVEL 3 o NEW PT ICD-10 Diagnosis Description 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

## 2018-11-15 NOTE — Progress Notes (Signed)
Sheena [name], Sheena [name] ([id number]) Visit Report for 11/14/2018 Abuse/Suicide Risk Screen Details Patient Name: [name], [name] Date of Service: 11/14/2018 8:45 AM Medical Record Number: [id number] Patient Account Number: [id number] Date of Birth/Sex: [birth year] (78 y.o. Female) Treating RN: Cornell Barman Primary Care Tiffini Blacksher: Glendon Axe Other Clinician: Referring Samira Acero: Campbell Lerner Treating Rasmus Preusser/Extender: STONE III, HOYT Weeks in Treatment: 0 Abuse/Suicide Risk Screen Items Answer ABUSE/SUICIDE RISK SCREEN: Has anyone close to you tried to hurt or harm you recentlyo No Do you feel uncomfortable with anyone in your familyo No Has anyone forced you do things that you didnot want to doo No Do you have any thoughts of harming yourselfo No Patient displays signs or symptoms of abuse and/or neglect. No Electronic Signature(s) Signed: 11/14/2018 5:06:25 PM By: Gretta Cool, BSN, RN, CWS, Kim RN, BSN Entered By: Gretta Cool, BSN, RN, CWS, Kim on 11/14/2018 09:13:30 Jerline Pain ([id number]) -------------------------------------------------------------------------------- Activities of Daily Living Details Patient Name: Sheena Robert A. Date of Service: 11/14/2018 8:45 AM Medical Record Number: [id number] Patient Account Number: [id number] Date of Birth/Sex: 07/11/1941 (78 y.o. Female) Treating RN: Cornell Barman Primary Care Tayelor Osborne: Glendon Axe Other Clinician: Referring Dupree Givler: Campbell Lerner Treating Dan Dissinger/Extender: Melburn Hake, HOYT Weeks in Treatment: 0 Activities of Daily Living Items Answer Activities of Daily Living (Please select one for each item) Drive Automobile Completely Able Take Medications Completely Able Use Telephone Completely Able Care for Appearance Completely Able Use Toilet Completely Able Bath / Shower Completely Able Dress Self Completely Able Feed Self Completely Able Walk Completely Able Get In / Out Bed Completely Able Housework Completely  Able Prepare Meals Completely Able Handle Money Completely Able Shop for Self Completely Able Electronic Signature(s) Signed: 11/14/2018 5:06:25 PM By: Gretta Cool, BSN, RN, CWS, Kim RN, BSN Entered By: Gretta Cool, BSN, RN, CWS, Kim on 11/14/2018 09:13:38 Jerline Pain ([id number]) -------------------------------------------------------------------------------- Education Assessment Details Patient Name: Sheena Robert A. Date of Service: 11/14/2018 8:45 AM Medical Record Number: [id number] Patient Account Number: [id number] Date of Birth/Sex: 08-Jun-1941 (78 y.o. Female) Treating RN: Cornell Barman Primary Care Kemonie Cutillo: Glendon Axe Other Clinician: Referring Gaylia Kassel: Campbell Lerner Treating Annette Liotta/Extender: Melburn Hake, HOYT Weeks in Treatment: 0 Primary Learner Assessed: Patient Learning Preferences/Education Level/Primary Language Learning Preference: Explanation Highest Education Level: College or Above Preferred Language: English Cognitive Barrier Assessment/Beliefs Language Barrier: No Translator Needed: No Memory Deficit: No Emotional Barrier: No Cultural/Religious Beliefs Affecting Medical Care: No Physical Barrier Assessment Impaired Vision: No Impaired Hearing: Yes Left ear Decreased Hand dexterity: No Knowledge/Comprehension Assessment Knowledge Level: High Comprehension Level: High Ability to understand written High instructions: Ability to understand verbal High instructions: Motivation Assessment Anxiety Level: Calm Cooperation: Cooperative Education Importance: Acknowledges Need Interest in Health Problems: Asks Questions Perception: Coherent Willingness to Engage in Self- High Management Activities: Readiness to Engage in Self- High Management Activities: Electronic Signature(s) Signed: 11/14/2018 5:06:25 PM By: Gretta Cool, BSN, RN, CWS, Kim RN, BSN Entered By: Gretta Cool, BSN, RN, CWS, Kim on 11/14/2018 09:14:09 Jerline Pain  ([id number]) -------------------------------------------------------------------------------- Fall Risk Assessment Details Patient Name: Sheena Robert A. Date of Service: 11/14/2018 8:45 AM Medical Record Number: [id number] Patient Account Number: [id number] Date of Birth/Sex: 01/24/1941 (78 y.o. Female) Treating RN: Cornell Barman Primary Care Kion Huntsberry: Glendon Axe Other Clinician: Referring Sanoe Hazan: Campbell Lerner Treating Cydni Reddoch/Extender: Melburn Hake, HOYT Weeks in Treatment: 0 Fall Risk Assessment Items Have you had 2 or more falls in the last 12 monthso 0 No Have you had any fall that resulted in injury in the last 12 monthso 0 No FALL RISK ASSESSMENT:  History of falling - immediate or within 3 months 0 No Secondary diagnosis 0 No Ambulatory aid None/bed rest/wheelchair/nurse 0 Yes Crutches/cane/walker 0 No Furniture 0 No IV Access/Saline Lock 0 No Gait/Training Normal/bed rest/immobile 0 Yes Weak 0 No Impaired 0 No Mental Status Oriented to own ability 0 Yes Electronic Signature(s) Signed: 11/14/2018 5:06:25 PM By: Gretta Cool, BSN, RN, CWS, Kim RN, BSN Entered By: Gretta Cool, BSN, RN, CWS, Kim on 11/14/2018 09:14:32 Jerline Pain ([id number]) -------------------------------------------------------------------------------- Foot Assessment Details Patient Name: Sheena Robert A. Date of Service: 11/14/2018 8:45 AM Medical Record Number: [id number] Patient Account Number: [id number] Date of Birth/Sex: 06/28/1941 (78 y.o. Female) Treating RN: Cornell Barman Primary Care Loyalty Brashier: Glendon Axe Other Clinician: Referring Cherry Wittwer: Campbell Lerner Treating Ermel Verne/Extender: STONE III, HOYT Weeks in Treatment: 0 Foot Assessment Items Site Locations + = Sensation present, - = Sensation absent, C = Callus, U = Ulcer R = Redness, W = Warmth, M = Maceration, PU = Pre-ulcerative lesion F = Fissure, S = Swelling, D = Dryness Assessment Right: Left: Other Deformity: No No Prior  Foot Ulcer: No No Prior Amputation: No No Charcot Joint: No No Ambulatory Status: Ambulatory Without Help Gait: Steady Electronic Signature(s) Signed: 11/14/2018 5:06:25 PM By: Gretta Cool, BSN, RN, CWS, Kim RN, BSN Entered By: Gretta Cool, BSN, RN, CWS, Kim on 11/14/2018 09:15:30 Jerline Pain ([id number]) -------------------------------------------------------------------------------- Nutrition Risk Assessment Details Patient Name: Sheena Robert A. Date of Service: 11/14/2018 8:45 AM Medical Record Number: [id number] Patient Account Number: [id number] Date of Birth/Sex: Jul 27, 1941 (78 y.o. Female) Treating RN: Cornell Barman Primary Care Kiowa Hollar: Glendon Axe Other Clinician: Referring Elonna Mcfarlane: Campbell Lerner Treating Marabeth Melland/Extender: STONE III, HOYT Weeks in Treatment: 0 Height (in): 61 Weight (lbs): 201.3 Body Mass Index (BMI): 38 Nutrition Risk Assessment Items NUTRITION RISK SCREEN: I have an illness or condition that made me change the kind and/or amount of 0 No food I eat I eat fewer than two meals per day 0 No I eat few fruits and vegetables, or milk products 0 No I have three or more drinks of beer, liquor or wine almost every day 0 No I have tooth or mouth problems that make it hard for me to eat 0 No I don't always have enough money to buy the food I need 0 No I eat alone most of the time 0 No I take three or more different prescribed or over-the-counter drugs a day 1 Yes Without wanting to, I have lost or gained 10 pounds in the last six months 0 No I am not always physically able to shop, cook and/or feed myself 0 No Nutrition Protocols Good Risk Protocol 0 No interventions needed Moderate Risk Protocol Electronic Signature(s) Signed: 11/14/2018 5:06:25 PM By: Gretta Cool, BSN, RN, CWS, Kim RN, BSN Entered By: Gretta Cool, BSN, RN, CWS, Kim on 11/14/2018 [place]

## 2018-11-21 ENCOUNTER — Encounter: Payer: PPO | Admitting: Physician Assistant

## 2018-11-21 DIAGNOSIS — E11622 Type 2 diabetes mellitus with other skin ulcer: Secondary | ICD-10-CM | POA: Diagnosis not present

## 2018-11-21 DIAGNOSIS — T8189XA Other complications of procedures, not elsewhere classified, initial encounter: Secondary | ICD-10-CM | POA: Diagnosis not present

## 2018-11-21 DIAGNOSIS — L97822 Non-pressure chronic ulcer of other part of left lower leg with fat layer exposed: Secondary | ICD-10-CM | POA: Diagnosis not present

## 2018-11-23 NOTE — Progress Notes (Signed)
EMNET, MONK ([id number]) Visit Report for 11/21/2018 Arrival Information Details Patient Name: Sheena [name], Sheena A. Date of Service: 11/21/2018 9:45 AM Medical Record Number: [id number] Patient Account Number: [id number] Date of Birth/Sex: [birth year] (78 y.o. F) Treating RN: Montey Hora Primary Care Edoardo Laforte: Glendon Axe Other Clinician: Referring Ibtisam Benge: Glendon Axe Treating Reeder Brisby/Extender: STONE III, HOYT Weeks in Treatment: 1 Visit Information History Since Last Visit Added or deleted any medications: No Patient Arrived: Ambulatory Any new allergies or adverse reactions: No Arrival Time: 09:51 Had a fall or experienced change in No Accompanied By: self activities of daily living that may affect Transfer Assistance: None risk of falls: Patient Identification Verified: Yes Signs or symptoms of abuse/neglect since last visito No Secondary Verification Process Completed: Yes Hospitalized since last visit: No Implantable device outside of the clinic excluding No cellular tissue based products placed in the center since last visit: Has Dressing in Place as Prescribed: Yes [name] Present Now: No Electronic Signature(s) Signed: 11/21/2018 4:33:45 PM By: Lorine Bears RCP, RRT, CHT Entered By: Becky Sax, Amado Nash on 11/21/2018 09:52:10 Wheatley, Retina A. ([id number]) -------------------------------------------------------------------------------- Compression Therapy Details Patient Name: Sheena Robert A. Date of Service: 11/21/2018 9:45 AM Medical Record Number: [id number] Patient Account Number: [id number] Date of Birth/Sex: Sep 13, 1941 (78 y.o. F) Treating RN: Montey Hora Primary Care Paul Torpey: Glendon Axe Other Clinician: Referring Gunther Zawadzki: Glendon Axe Treating Khamora Karan/Extender: STONE III, HOYT Weeks in Treatment: 1 Compression Therapy Performed for Wound Assessment: Wound #1 Left,Midline,Anterior Lower Leg Performed By:  Clinician Montey Hora, RN Compression Type: Three Layer Pre Treatment ABI: 1.1 Post Procedure Diagnosis Same as Pre-procedure Electronic Signature(s) Signed: 11/21/2018 5:03:48 PM By: Montey Hora Entered By: Montey Hora on 11/21/2018 10:18:54 Sheena [name] ([id number]) -------------------------------------------------------------------------------- Encounter Discharge Information Details Patient Name: Sheena Robert A. Date of Service: 11/21/2018 9:45 AM Medical Record Number: [id number] Patient Account Number: [id number] Date of Birth/Sex: [birth year] (78 y.o. F) Treating RN: Montey Hora Primary Care Lexander Tremblay: Glendon Axe Other Clinician: Referring Nickolaos Brallier: Glendon Axe Treating Cinque Begley/Extender: Melburn Hake, HOYT Weeks in Treatment: 1 Encounter Discharge Information Items Discharge Condition: Stable Ambulatory Status: Ambulatory Discharge Destination: Home Transportation: Private Auto Accompanied By: self Schedule Follow-up Appointment: Yes Clinical Summary of Care: Electronic Signature(s) Signed: 11/21/2018 5:03:48 PM By: Montey Hora Entered By: Montey Hora on 11/21/2018 10:21:03 Sheena [name] ([id number]) -------------------------------------------------------------------------------- Lower Extremity Assessment Details Patient Name: Sheena Robert A. Date of Service: 11/21/2018 9:45 AM Medical Record Number: [id number] Patient Account Number: [id number] Date of Birth/Sex: [birth year] (78 y.o. F) Treating RN: Secundino Ginger Primary Care Bessy Reaney: Glendon Axe Other Clinician: Referring Kemia Wendel: Glendon Axe Treating Kamaile Zachow/Extender: STONE III, HOYT Weeks in Treatment: 1 Edema Assessment Assessed: [Left: No] [Right: No] [Left: Edema] [Right: :] Calf Left: Right: Point of Measurement: 29 cm From Medial Instep 37.4 cm cm Ankle Left: Right: Point of Measurement: 14 cm From Medial Instep 24 cm cm Vascular  Assessment Claudication: Claudication Assessment [Left:None] Pulses: Dorsalis Pedis Palpable: [Left:Yes] Posterior Tibial Extremity colors, hair growth, and conditions: Extremity Color: [Left:Normal] Hair Growth on Extremity: [Left:No] Temperature of Extremity: [Left:Cool] Capillary Refill: [Left:< 3 seconds] Toe Nail Assessment Left: Right: Thick: Yes Discolored: Yes Deformed: No Improper Length and Hygiene: No Electronic Signature(s) Signed: 11/21/2018 3:42:40 PM By: Secundino Ginger Entered By: Secundino Ginger on 11/21/2018 10:03:37 Sheena Robert A. ([id number]) -------------------------------------------------------------------------------- Multi Wound Chart Details Patient Name: Sheena Robert A. Date of Service: 11/21/2018 9:45 AM Medical Record Number: [id number] Patient Account Number: [id number] Date of Birth/Sex: Mar 05, 1941 (78 y.o. F) Treating RN: Montey Hora Primary Care  Renton Berkley: Glendon Axe Other Clinician: Referring Archimedes Harold: Glendon Axe Treating Li Bobo/Extender: STONE III, HOYT Weeks in Treatment: 1 Vital Signs Height(in): 61 Pulse(bpm): 73 Weight(lbs): 201.3 Blood Pressure(mmHg): 149/58 Body Mass Index(BMI): 38 Temperature(F): 97.7 Respiratory Rate 16 (breaths/min): Photos: [1:No Photos] [N/A:N/A] Wound Location: [1:Left Lower Leg - Midline, Anterior] [N/A:N/A] Wounding Event: [1:Surgical Injury] [N/A:N/A] Primary Etiology: [1:Diabetic Wound/Ulcer of the Lower Extremity] [N/A:N/A] Comorbid History: [1:Cataracts, Anemia, Hypertension, Type II Diabetes, End Stage Renal Disease, Neuropathy] [N/A:N/A] Date Acquired: [1:08/01/2018] [N/A:N/A] Weeks of Treatment: [1:1] [N/A:N/A] Wound Status: [1:Open] [N/A:N/A] Measurements L x W x D [1:0.9x0.9x0.3] [N/A:N/A] (cm) Area (cm) : [1:0.636] [N/A:N/A] Volume (cm) : [1:0.191] [N/A:N/A] % Reduction in Area: [1:61.40%] [N/A:N/A] % Reduction in Volume: [1:61.40%] [N/A:N/A] Classification: [1:Grade 2]  [N/A:N/A] Exudate Amount: [1:Small] [N/A:N/A] Exudate Type: [1:Serous] [N/A:N/A] Exudate Color: [1:amber] [N/A:N/A] Wound Margin: [1:Thickened] [N/A:N/A] Granulation Amount: [1:None Present (0%)] [N/A:N/A] Necrotic Amount: [1:Large (67-100%)] [N/A:N/A] Exposed Structures: [1:Fat Layer (Subcutaneous Tissue) Exposed: Yes Fascia: No Tendon: No Muscle: No Joint: No Bone: No] [N/A:N/A] Epithelialization: [1:None] [N/A:N/A] Periwound Skin Texture: [1:Excoriation: No Induration: No Callus: No] [N/A:N/A] Crepitus: No Rash: No Scarring: No Periwound Skin Moisture: Maceration: No N/A N/A Dry/Scaly: No Periwound Skin Color: Erythema: Yes N/A N/A Atrophie Blanche: No Cyanosis: No Ecchymosis: No Hemosiderin Staining: No Mottled: No Pallor: No Rubor: No Erythema Location: Circumferential N/A N/A Temperature: No Abnormality N/A N/A Tenderness on Palpation: Yes N/A N/A Wound Preparation: Ulcer Cleansing: N/A N/A Rinsed/Irrigated with Saline Topical Anesthetic Applied: Other: lidocaine 4% Treatment Notes Electronic Signature(s) Signed: 11/21/2018 5:03:48 PM By: Montey Hora Entered By: Montey Hora on 11/21/2018 10:14:28 Sheena [name] ([id number]) -------------------------------------------------------------------------------- Multi-Disciplinary Care Plan Details Patient Name: Sheena Robert A. Date of Service: 11/21/2018 9:45 AM Medical Record Number: [id number] Patient Account Number: [id number] Date of Birth/Sex: 1940-11-26 (78 y.o. F) Treating RN: Montey Hora Primary Care Keishia Ground: Glendon Axe Other Clinician: Referring Harpreet Signore: Glendon Axe Treating Lyrik Buresh/Extender: STONE III, HOYT Weeks in Treatment: 1 Active Inactive Nutrition Nursing Diagnoses: Imbalanced nutrition Goals: Patient/caregiver agrees to and verbalizes understanding of need to use nutritional supplements and/or vitamins as prescribed Date Initiated: 11/14/2018 Target Resolution Date:  02/03/2019 Goal Status: Active Interventions: Assess patient nutrition upon admission and as needed per policy Notes: Orientation to the Wound Care Program Nursing Diagnoses: Knowledge deficit related to the wound healing center program Goals: Patient/caregiver will verbalize understanding of the [facility] Date Initiated: 11/14/2018 Target Resolution Date: 02/03/2019 Goal Status: Active Interventions: Provide education on orientation to the wound center Notes: [name], Acute or Chronic Nursing Diagnoses: [name], acute or chronic: actual or potential Goals: Patient will verbalize adequate [name] control and receive [name] control interventions during procedures as needed Date Initiated: 11/14/2018 Target Resolution Date: 02/03/2019 Goal Status: Active Interventions: Complete [name] assessment as per visit requirements Bohne, Lariza A. ([id number]) Notes: Wound/Skin Impairment Nursing Diagnoses: Impaired tissue integrity Goals: Ulcer/skin breakdown will heal within 14 weeks Date Initiated: 11/14/2018 Target Resolution Date: 02/03/2019 Goal Status: Active Interventions: Assess patient/caregiver ability to obtain necessary supplies Assess patient/caregiver ability to perform ulcer/skin care regimen upon admission and as needed Assess ulceration(s) every visit Notes: Electronic Signature(s) Signed: 11/21/2018 5:03:48 PM By: Montey Hora Entered By: Montey Hora on 11/21/2018 10:14:18 Chastain, Gowri A. ([id number]) -------------------------------------------------------------------------------- [name] Assessment Details Patient Name: Sheena Robert A. Date of Service: 11/21/2018 9:45 AM Medical Record Number: [id number] Patient Account Number: [id number] Date of Birth/Sex: Mar 09, 1941 (78 y.o. F) Treating RN: Montey Hora Primary Care Karess Harner: Glendon Axe Other Clinician: Referring Rocco Kerkhoff: Glendon Axe Treating Stelios Kirby/Extender: Joaquim Lai  III, HOYT Weeks in  Treatment: 1 Active Problems Location of [name] Severity and Description of [name] Patient Has Paino No Site Locations [name] Management and Medication Current [name] Management: Electronic Signature(s) Signed: 11/21/2018 4:33:45 PM By: Paulla Fore, RRT, CHT Signed: 11/21/2018 5:03:48 PM By: Montey Hora Entered By: Lorine Bears on 11/21/2018 09:52:16 Sheena [name] ([id number]) -------------------------------------------------------------------------------- Patient/Caregiver Education Details Patient Name: Sheena Robert A. Date of Service: 11/21/2018 9:45 AM Medical Record Number: [id number] Patient Account Number: [id number] Date of Birth/Gender: [birth year] (78 y.o. F) Treating RN: Montey Hora Primary Care Physician: Glendon Axe Other Clinician: Referring Physician: Glendon Axe Treating Physician/Extender: Melburn Hake, HOYT Weeks in Treatment: 1 Education Assessment Education Provided To: Patient Education Topics Provided Venous: Handouts: Other: purpose of compression wraps and precautions/reportable s/s Methods: Explain/Verbal Responses: State content correctly Electronic Signature(s) Signed: 11/21/2018 5:03:48 PM By: Montey Hora Entered By: Montey Hora on 11/21/2018 10:17:00 Sheena Robert A. ([id number]) -------------------------------------------------------------------------------- Wound Assessment Details Patient Name: Sheena Robert A. Date of Service: 11/21/2018 9:45 AM Medical Record Number: [id number] Patient Account Number: [id number] Date of Birth/Sex: 07-18-1941 (78 y.o. F) Treating RN: Secundino Ginger Primary Care Lorenso Quirino: Glendon Axe Other Clinician: Referring Veronda Gabor: Glendon Axe Treating Hopelynn Gartland/Extender: STONE III, HOYT Weeks in Treatment: 1 Wound Status Wound Number: 1 Primary Diabetic Wound/Ulcer of the Lower Extremity Etiology: Wound Location: Left Lower Leg - Midline, Anterior Wound  Open Wounding Event: Surgical Injury Status: Date Acquired: 08/01/2018 Comorbid Cataracts, Anemia, Hypertension, Type II Weeks Of Treatment: 1 History: Diabetes, End Stage Renal Disease, Clustered Wound: No Neuropathy Photos Photo Uploaded By: Secundino Ginger on 11/21/2018 11:20:02 Wound Measurements Length: (cm) 0.9 Width: (cm) 0.9 Depth: (cm) 0.3 Area: (cm) 0.636 Volume: (cm) 0.191 % Reduction in Area: 61.4% % Reduction in Volume: 61.4% Epithelialization: None Wound Description Classification: Grade 2 Wound Margin: Thickened Exudate Amount: Small Exudate Type: Serous Exudate Color: amber Foul Odor After Cleansing: No Slough/Fibrino Yes Wound Bed Granulation Amount: None Present (0%) Exposed Structure Necrotic Amount: Large (67-100%) Fascia Exposed: No Necrotic Quality: Adherent Slough Fat Layer (Subcutaneous Tissue) Exposed: Yes Tendon Exposed: No Muscle Exposed: No Joint Exposed: No Bone Exposed: No Periwound Skin Texture Ruggles, Katryna A. ([id number]) Texture Color No Abnormalities Noted: Yes No Abnormalities Noted: No Atrophie Blanche: No Moisture Cyanosis: No No Abnormalities Noted: Yes Ecchymosis: No Erythema: Yes Erythema Location: Circumferential Hemosiderin Staining: No Mottled: No Pallor: No Rubor: No Temperature / [name] Temperature: No Abnormality Tenderness on Palpation: Yes Wound Preparation Ulcer Cleansing: Rinsed/Irrigated with Saline Topical Anesthetic Applied: Other: lidocaine 4%, Treatment Notes Wound #1 (Left, Midline, Anterior Lower Leg) Notes prisma, ABD, 3 layer wrap with unna to anchor Electronic Signature(s) Signed: 11/21/2018 3:42:40 PM By: Secundino Ginger Entered By: Secundino Ginger on 11/21/2018 10:02:04 Sheena Robert A. ([id number]) -------------------------------------------------------------------------------- Vitals Details Patient Name: Sheena Robert A. Date of Service: 11/21/2018 9:45 AM Medical Record Number:  [id number] Patient Account Number: [id number] Date of Birth/Sex: [birth year] (78 y.o. F) Treating RN: Montey Hora Primary Care Londin Antone: Glendon Axe Other Clinician: Referring Angeleen Horney: Glendon Axe Treating Lasharn Bufkin/Extender: STONE III, HOYT Weeks in Treatment: 1 Vital Signs Time Taken: 09:52 Temperature (F): 97.7 Height (in): 61 Pulse (bpm): 73 Weight (lbs): 201.3 Respiratory Rate (breaths/min): 16 Body Mass Index (BMI): 38 Blood Pressure (mmHg): 149/58 Reference Range: 80 - 120 mg / dl Electronic Signature(s) Signed: 11/21/2018 4:33:45 PM By: Lorine Bears RCP, RRT, CHT Entered By: Lorine Bears on 11/21/2018 09:55:45

## 2018-11-23 NOTE — Progress Notes (Signed)
Sheena, [name] ([id number]) Visit Report for 11/21/2018 Chief Complaint Document Details Patient Name: Sheena [name], Sheena A. Date of Service: 11/21/2018 9:45 AM Medical Record Number: [id number] Patient Account Number: [id number] Date of Birth/Sex: 12/26/1940 (78 y.o. F) Treating RN: Montey Hora Primary Care Provider: Glendon Axe Other Clinician: Referring Provider: Glendon Axe Treating Provider/Extender: Melburn Hake, HOYT Weeks in Treatment: 1 Information Obtained from: Patient Chief Complaint Left leg ulcer Electronic Signature(s) Signed: 11/23/2018 9:23:56 AM By: Worthy Keeler PA-C Entered By: Worthy Keeler on 11/21/2018 10:10:44 Jerline Pain ([id number]) -------------------------------------------------------------------------------- HPI Details Patient Name: Sheena Robert A. Date of Service: 11/21/2018 9:45 AM Medical Record Number: [id number] Patient Account Number: [id number] Date of Birth/Sex: 01-16-1941 (77 y.o. F) Treating RN: Montey Hora Primary Care Provider: Glendon Axe Other Clinician: Referring Provider: Glendon Axe Treating Provider/Extender: Melburn Hake, HOYT Weeks in Treatment: 1 History of Present Illness HPI Description: 11/14/18 on evaluation today patient presents for initial evaluation our clinic concerning issues that she is having with her left lower extremity secondary to a biopsy which was performed on August 01, 2018. This area come back as an actinic keratosis and was not cancerous. With that being said she's had a very difficult time getting this we can heal although she states and last month it has been somewhat better than previous. Fortunately there does not appear to be any signs of infection. She did have recommendations from the dermatologist to do vinegar soaks twice a day along with applying Bactroban following. She is a diabetic type II with her most recent reported hemoglobin A1c of 6.7 this is from the patient we did  not have record of this. She also has hypertension and chronic kidney disease stage III along with hypothyroidism. No fevers, chills, nausea, or vomiting noted at this time. 11/21/18 on evaluation today patient does appear to be showing signs of improvement which is good news. She has been tolerating the dressing changes without complication. Fortunately there does not appear to be any sign of infection which is also great news. With that being said she does have some swelling of the lower extremity. This is a little bit more than what she's used to having for the most part. She is having no pain in hopes that I do not have to do a lot in the way of debridement today. Electronic Signature(s) Signed: 11/23/2018 9:23:56 AM By: Worthy Keeler PA-C Entered By: Worthy Keeler on 11/21/2018 10:23:38 Jerline Pain ([id number]) -------------------------------------------------------------------------------- Physical Exam Details Patient Name: Sheena Robert A. Date of Service: 11/21/2018 9:45 AM Medical Record Number: [id number] Patient Account Number: [id number] Date of Birth/Sex: December 13, 1940 (78 y.o. F) Treating RN: Montey Hora Primary Care Provider: Glendon Axe Other Clinician: Referring Provider: Glendon Axe Treating Provider/Extender: STONE III, HOYT Weeks in Treatment: 1 Constitutional Well-nourished and well-hydrated in no acute distress. Respiratory normal breathing without difficulty. Psychiatric this patient is able to make decisions and demonstrates good insight into disease process. Alert and Oriented x 3. pleasant and cooperative. Notes Patient's wound bed currently shows evidence of good granulation. There was some Slough which was gently debrided with saline and gauze she tolerated this well today without complication no sharp debridement required. Post debridement the wound bed appears to be doing very well I do think that the one issue I see is that she does have  some lower extremity edema which would probably benefit from a compression wrap. Electronic Signature(s) Signed: 11/23/2018 9:23:56 AM By: Worthy Keeler PA-C Entered By: Worthy Keeler  on 11/21/2018 10:24:14 NEOSHA, SWITALSKI A. ([id number]) -------------------------------------------------------------------------------- Physician Orders Details Patient Name: [name], [name] Date of Service: 11/21/2018 9:45 AM Medical Record Number: [id number] Patient Account Number: [id number] Date of Birth/Sex: 1941-10-10 (78 y.o. F) Treating RN: Montey Hora Primary Care Provider: Glendon Axe Other Clinician: Referring Provider: Glendon Axe Treating Provider/Extender: Melburn Hake, HOYT Weeks in Treatment: 1 Verbal / Phone Orders: No Diagnosis Coding ICD-10 Coding Code Description E11.622 Type 2 diabetes mellitus with other skin ulcer L97.822 Non-pressure chronic ulcer of other part of left lower leg with fat layer exposed I10 Essential (primary) hypertension N18.3 Chronic kidney disease, stage 3 (moderate) Wound Cleansing Wound #1 Left,Midline,Anterior Lower Leg o Clean wound with Normal Saline. o May shower with protection. - PLEASE DO NOT GET YOUR LEG WRAP WET AND CALL us IF YOU DO Anesthetic (add to Medication List) Wound #1 Left,Midline,Anterior Lower Leg o Topical Lidocaine 4% cream applied to wound bed prior to debridement (In Clinic Only). Primary Wound Dressing Wound #1 Left,Midline,Anterior Lower Leg o Silver Collagen - moisten with saline Secondary Dressing Wound #1 Left,Midline,Anterior Lower Leg o ABD pad Dressing Change Frequency Wound #1 Left,Midline,Anterior Lower Leg o Change dressing every week Follow-up Appointments Wound #1 Left,Midline,Anterior Lower Leg o Return Appointment in 1 week. Edema Control Wound #1 Left,Midline,Anterior Lower Leg o 3 Layer Compression System - Left Lower Extremity o Elevate legs to the level of the heart and  pump ankles as often as possible Electronic Signature(s) Signed: 11/21/2018 5:03:48 PM By: Joline Salt, Jarvis Morgan ([id number]) Signed: 11/23/2018 9:23:56 AM By: Worthy Keeler PA-C Entered By: Montey Hora on 11/21/2018 10:19:56 Pickrell, Graclyn A. ([id number]) -------------------------------------------------------------------------------- Problem List Details Patient Name: Sheena Robert A. Date of Service: 11/21/2018 9:45 AM Medical Record Number: [id number] Patient Account Number: [id number] Date of Birth/Sex: [birth year] (78 y.o. F) Treating RN: Montey Hora Primary Care Provider: Glendon Axe Other Clinician: Referring Provider: Glendon Axe Treating Provider/Extender: Melburn Hake, HOYT Weeks in Treatment: 1 Active Problems ICD-10 Evaluated Encounter Code Description Active Date Today Diagnosis E11.622 Type 2 diabetes mellitus with other skin ulcer 11/14/2018 No Yes L97.822 Non-pressure chronic ulcer of other part of left lower leg with 11/14/2018 No Yes fat layer exposed I10 Essential (primary) hypertension 11/14/2018 No Yes N18.3 Chronic kidney disease, stage 3 (moderate) 11/14/2018 No Yes Inactive Problems Resolved Problems Electronic Signature(s) Signed: 11/23/2018 9:23:56 AM By: Worthy Keeler PA-C Entered By: Worthy Keeler on 11/21/2018 10:10:38 Sheena Robert A. ([id number]) -------------------------------------------------------------------------------- Progress Note Details Patient Name: Sheena Robert A. Date of Service: 11/21/2018 9:45 AM Medical Record Number: [id number] Patient Account Number: [id number] Date of Birth/Sex: [birth year] (78 y.o. F) Treating RN: Montey Hora Primary Care Provider: Glendon Axe Other Clinician: Referring Provider: Glendon Axe Treating Provider/Extender: Melburn Hake, HOYT Weeks in Treatment: 1 Subjective Chief Complaint Information obtained from Patient Left leg ulcer History of Present Illness  (HPI) 11/14/18 on evaluation today patient presents for initial evaluation our clinic concerning issues that she is having with her left lower extremity secondary to a biopsy which was performed on August 01, 2018. This area come back as an actinic keratosis and was not cancerous. With that being said she's had a very difficult time getting this we can heal although she states and last month it has been somewhat better than previous. Fortunately there does not appear to be any signs of infection. She did have recommendations from the dermatologist to do vinegar soaks twice a day along with applying Bactroban following. She is a diabetic type II  with her most recent reported hemoglobin A1c of 6.7 this is from the patient we did not have record of this. She also has hypertension and chronic kidney disease stage III along with hypothyroidism. No fevers, chills, nausea, or vomiting noted at this time. 11/21/18 on evaluation today patient does appear to be showing signs of improvement which is good news. She has been tolerating the dressing changes without complication. Fortunately there does not appear to be any sign of infection which is also great news. With that being said she does have some swelling of the lower extremity. This is a little bit more than what she's used to having for the most part. She is having no pain in hopes that I do not have to do a lot in the way of debridement today. Patient History Information obtained from Patient. Family History Diabetes - Paternal Grandparents, Hypertension - Father, Stroke - Father, No family history of Cancer, Heart Disease, Hereditary Spherocytosis, Kidney Disease, Lung Disease, Seizures, Thyroid Problems, Tuberculosis. Social History Never smoker, Marital Status - Married, Alcohol Use - Never, Drug Use - No History, Caffeine Use - Moderate. Review of Systems (ROS) Constitutional Symptoms (General Health) Denies complaints or symptoms of Fever,  Chills. Respiratory The patient has no complaints or symptoms. Cardiovascular Complains or has symptoms of LE edema. Psychiatric The patient has no complaints or symptoms. Guerrant, Afia A. ([id number]) Objective Constitutional Well-nourished and well-hydrated in no acute distress. Vitals Time Taken: 9:52 AM, Height: 61 in, Weight: 201.3 lbs, BMI: 38, Temperature: 97.7 F, Pulse: 73 bpm, Respiratory Rate: 16 breaths/min, Blood Pressure: 149/58 mmHg. Respiratory normal breathing without difficulty. Psychiatric this patient is able to make decisions and demonstrates good insight into disease process. Alert and Oriented x 3. pleasant and cooperative. General Notes: Patient's wound bed currently shows evidence of good granulation. There was some Slough which was gently debrided with saline and gauze she tolerated this well today without complication no sharp debridement required. Post debridement the wound bed appears to be doing very well I do think that the one issue I see is that she does have some lower extremity edema which would probably benefit from a compression wrap. Integumentary (Hair, Skin) Wound #1 status is Open. Original cause of wound was Surgical Injury. The wound is located on the Left,Midline,Anterior Lower Leg. The wound measures 0.9cm length x 0.9cm width x 0.3cm depth; 0.636cm^2 area and 0.191cm^3 volume. There is Fat Layer (Subcutaneous Tissue) Exposed exposed. There is a small amount of serous drainage noted. The wound margin is thickened. There is no granulation within the wound bed. There is a large (67-100%) amount of necrotic tissue within the wound bed including Adherent Slough. The periwound skin appearance had no abnormalities noted for texture. The periwound skin appearance had no abnormalities noted for moisture. The periwound skin appearance exhibited: Erythema. The periwound skin appearance did not exhibit: Atrophie Blanche, Cyanosis, Ecchymosis,  Hemosiderin Staining, Mottled, Pallor, Rubor. The surrounding wound skin color is noted with erythema which is circumferential. Periwound temperature was noted as No Abnormality. The periwound has tenderness on palpation. Assessment Active Problems ICD-10 Type 2 diabetes mellitus with other skin ulcer Non-pressure chronic ulcer of other part of left lower leg with fat layer exposed Essential (primary) hypertension Chronic kidney disease, stage 3 (moderate) Procedures Wound #1 Pre-procedure diagnosis of Wound #1 is a Diabetic Wound/Ulcer of the Lower Extremity located on the Left,Midline,Anterior Simington, Kemiya A. ([id number]) Lower Leg . There was a Three Layer Compression Therapy Procedure with a pre-treatment ABI  of 1.1 by Montey Hora, RN. Post procedure Diagnosis Wound #1: Same as Pre-Procedure Plan Wound Cleansing: Wound #1 Left,Midline,Anterior Lower Leg: Clean wound with Normal Saline. May shower with protection. - PLEASE DO NOT GET YOUR LEG WRAP WET AND CALL us IF YOU DO Anesthetic (add to Medication List): Wound #1 Left,Midline,Anterior Lower Leg: Topical Lidocaine 4% cream applied to wound bed prior to debridement (In Clinic Only). Primary Wound Dressing: Wound #1 Left,Midline,Anterior Lower Leg: Silver Collagen - moisten with saline Secondary Dressing: Wound #1 Left,Midline,Anterior Lower Leg: ABD pad Dressing Change Frequency: Wound #1 Left,Midline,Anterior Lower Leg: Change dressing every week Follow-up Appointments: Wound #1 Left,Midline,Anterior Lower Leg: Return Appointment in 1 week. Edema Control: Wound #1 Left,Midline,Anterior Lower Leg: 3 Layer Compression System - Left Lower Extremity Elevate legs to the level of the heart and pump ankles as often as possible I discussed how the compression wrap would work with the patient today. She will reluctantly in my pinion agreed to proceed with the wrap she understands that this does stay on for the week and  needs to be protected from getting wet. Subsequently we're gonna see were things stand at follow-up. If anything changes or worsens she will let me know. Please see above for specific wound care orders. We will see patient for re-evaluation in 1 week(s) here in the clinic. If anything worsens or changes patient will contact our office for additional recommendations. Electronic Signature(s) Signed: 11/23/2018 9:23:56 AM By: Worthy Keeler PA-C Entered By: Worthy Keeler on 11/21/2018 10:24:42 Jerline Pain ([id number]) -------------------------------------------------------------------------------- ROS/PFSH Details Patient Name: Sheena Robert A. Date of Service: 11/21/2018 9:45 AM Medical Record Number: [id number] Patient Account Number: [id number] Date of Birth/Sex: 12/25/1940 (78 y.o. F) Treating RN: Montey Hora Primary Care Provider: Glendon Axe Other Clinician: Referring Provider: Glendon Axe Treating Provider/Extender: STONE III, HOYT Weeks in Treatment: 1 Information Obtained From Patient Wound History Do you currently have one or more open woundso Yes How many open wounds do you currently haveo 1 Approximately how long have you had your woundso october How have you been treating your wound(s) until nowo Mupirocin Has your wound(s) ever healed and then re-openedo No Have you had any lab work done in the past montho No Have you tested positive for an antibiotic resistant organism (MRSA, VRE)o No Have you tested positive for osteomyelitis (bone infection)o No Have you had any tests for circulation on your legso No Constitutional Symptoms (General Health) Complaints and Symptoms: Negative for: Fever; Chills Cardiovascular Complaints and Symptoms: Positive for: LE edema Medical History: Positive for: Hypertension - Medication controlled Negative for: Angina; Arrhythmia; Congestive Heart Failure; Coronary Artery Disease; Deep Vein Thrombosis;  Hypotension; Myocardial Infarction; Peripheral Arterial Disease; Peripheral Venous Disease; Phlebitis; Vasculitis Eyes Medical History: Positive for: Cataracts - Both removed Negative for: Glaucoma Ear/Nose/Mouth/Throat Medical History: Negative for: Chronic sinus problems/congestion; Middle ear problems Hematologic/Lymphatic Medical History: Positive for: Anemia Negative for: Hemophilia; Human Immunodeficiency Virus; Lymphedema; Sickle Cell Disease Respiratory Complaints and Symptoms: No Complaints or Symptoms Iqbal, Jayliana A. 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 Immunological Medical History: Negative for: Lupus Erythematosus; Raynaudos; Scleroderma Integumentary (Skin) Medical  History: Negative for: History of Burn; History of pressure wounds Musculoskeletal Medical History: Negative for: Gout; Rheumatoid Arthritis; Osteoarthritis; Osteomyelitis Neurologic Medical History: Positive for: Neuropathy Negative for: Dementia; Quadriplegia; Paraplegia; Seizure Disorder Oncologic Medical History: Negative for: Received Chemotherapy; Received Radiation Psychiatric Complaints and Symptoms: No Complaints or Symptoms Medical History: Negative for: Anorexia/bulimia; Confinement Anxiety Kindred, Audriella A. ([id number]) HBO Extended History Items Eyes: Cataracts Immunizations Pneumococcal Vaccine: Received Pneumococcal Vaccination: Yes Implantable Devices Family and Social History Cancer: No;  Diabetes: Yes - Paternal Grandparents; Heart Disease: No; Hereditary Spherocytosis: No; Hypertension: Yes - Father; Kidney Disease: No; Lung Disease: No; Seizures: No; Stroke: Yes - Father; Thyroid Problems: No; Tuberculosis: No; Never smoker; Marital Status - Married; Alcohol Use: Never; Drug Use: No History; Caffeine Use: Moderate; Advanced Directives: No; Patient does not want information on Advanced Directives; Do not resuscitate: No; Living Will: No; Medical Power of Attorney: No Physician Affirmation I have reviewed and agree with the above information. Electronic Signature(s) Signed: 11/21/2018 5:03:48 PM By: Montey Hora Signed: 11/23/2018 9:23:56 AM By: Worthy Keeler PA-C Entered By: Worthy Keeler on 11/21/2018 10:24:02 Jerline Pain ([id number]) -------------------------------------------------------------------------------- SuperBill Details Patient Name: Sheena Robert A. Date of Service: 11/21/2018 Medical Record Number: [id number] Patient Account Number: [id number] Date of Birth/Sex: 1941-06-29 (78 y.o. F) Treating RN: Montey Hora Primary Care Provider: Glendon Axe Other Clinician: Referring Provider: Glendon Axe Treating Provider/Extender: Melburn Hake, HOYT Weeks in Treatment: 1 Diagnosis Coding ICD-10 Codes Code Description E11.622 Type 2 diabetes mellitus with other skin ulcer L97.822 Non-pressure chronic ulcer of other part of left lower leg with fat layer exposed I10 Essential (primary) hypertension N18.3 Chronic kidney disease, stage 3 (moderate) Facility Procedures CPT4 Code: [id number] Description: (Facility Use Only) [id number] - [facility] LWR LT LEG Modifier: Quantity: 1 Physician Procedures CPT4 Code Description: [id number] - WC PHYS LEVEL 4 - EST PT ICD-10 Diagnosis Description E11.622 Type 2 diabetes mellitus with other skin ulcer L97.822 Non-pressure chronic ulcer of other part of left lower leg wit I10 Essential (primary)   hypertension N18.3 Chronic kidney disease, stage 3 (moderate) Modifier: h fat layer expos Quantity: 1 ed Electronic Signature(s) Signed: 11/23/2018 9:23:56 AM By: Worthy Keeler PA-C Entered By: Worthy Keeler on 11/21/2018 10:24:56

## 2018-11-28 ENCOUNTER — Encounter: Payer: PPO | Admitting: Physician Assistant

## 2018-11-28 DIAGNOSIS — L97822 Non-pressure chronic ulcer of other part of left lower leg with fat layer exposed: Secondary | ICD-10-CM | POA: Diagnosis not present

## 2018-11-28 DIAGNOSIS — E11622 Type 2 diabetes mellitus with other skin ulcer: Secondary | ICD-10-CM | POA: Diagnosis not present

## 2018-11-28 DIAGNOSIS — T8189XA Other complications of procedures, not elsewhere classified, initial encounter: Secondary | ICD-10-CM | POA: Diagnosis not present

## 2018-11-30 NOTE — Progress Notes (Signed)
SRI, CLEGG ([id number]) Visit Report for 11/28/2018 Arrival Information Details Patient Name: Sheena [name], Sheena A. Date of Service: 11/28/2018 10:30 AM Medical Record Number: [id number] Patient Account Number: [id number] Date of Birth/Sex: 1940-11-15 (78 y.o. F) Treating RN: Montey Hora Primary Care Kyann Heydt: Glendon Axe Other Clinician: Referring Khy Pitre: Glendon Axe Treating Richmond Coldren/Extender: STONE III, HOYT Weeks in Treatment: 2 Visit Information History Since Last Visit Added or deleted any medications: No Patient Arrived: Ambulatory Any new allergies or adverse reactions: No Arrival Time: 10:30 Had a fall or experienced change in No Accompanied By: self activities of daily living that may affect Transfer Assistance: None risk of falls: Patient Identification Verified: Yes Signs or symptoms of abuse/neglect since last visito No Secondary Verification Process Completed: Yes Hospitalized since last visit: No Implantable device outside of the clinic excluding No cellular tissue based products placed in the center since last visit: Has Dressing in Place as Prescribed: Yes Pain Present Now: No Electronic Signature(s) Signed: 11/28/2018 4:05:03 PM By: Lorine Bears RCP, RRT, CHT Entered By: Becky Sax, Amado Nash on 11/28/2018 10:32:16 [name], Sheena. ([id number]) -------------------------------------------------------------------------------- Compression Therapy Details Patient Name: Sheena Robert A. Date of Service: 11/28/2018 10:30 AM Medical Record Number: [id number] Patient Account Number: [id number] Date of Birth/Sex: 1941/02/01 (78 y.o. F) Treating RN: Montey Hora Primary Care Anajah Sterbenz: Glendon Axe Other Clinician: Referring Lavon Horn: Glendon Axe Treating Brylee Berk/Extender: STONE III, HOYT Weeks in Treatment: 2 Compression Therapy Performed for Wound Assessment: Wound #1 Left,Midline,Anterior Lower Leg Performed By:  Clinician Montey Hora, RN Compression Type: Three Layer Pre Treatment ABI: 1.1 Post Procedure Diagnosis Same as Pre-procedure Electronic Signature(s) Signed: 11/28/2018 4:47:13 PM By: Montey Hora Entered By: Montey Hora on 11/28/2018 11:19:43 Jerline Pain ([id number]) -------------------------------------------------------------------------------- Encounter Discharge Information Details Patient Name: Sheena Robert A. Date of Service: 11/28/2018 10:30 AM Medical Record Number: [id number] Patient Account Number: [id number] Date of Birth/Sex: Mar 15, 1941 (78 y.o. F) Treating RN: Montey Hora Primary Care Randal Goens: Glendon Axe Other Clinician: Referring Saree Krogh: Glendon Axe Treating Donyae Kohn/Extender: Melburn Hake, HOYT Weeks in Treatment: 2 Encounter Discharge Information Items Discharge Condition: Stable Ambulatory Status: Ambulatory Discharge Destination: Home Transportation: Private Auto Accompanied By: self Schedule Follow-up Appointment: Yes Clinical Summary of Care: Electronic Signature(s) Signed: 11/28/2018 4:47:13 PM By: Montey Hora Entered By: Montey Hora on 11/28/2018 11:20:36 Sheena Robert A. ([id number]) -------------------------------------------------------------------------------- Lower Extremity Assessment Details Patient Name: Sheena Robert A. Date of Service: 11/28/2018 10:30 AM Medical Record Number: [id number] Patient Account Number: [id number] Date of Birth/Sex: [birth year] (78 y.o. F) Treating RN: Secundino Ginger Primary Care Norena Bratton: Glendon Axe Other Clinician: Referring Madalina Rosman: Glendon Axe Treating Mar Zettler/Extender: STONE III, HOYT Weeks in Treatment: 2 Edema Assessment Assessed: [Left: No] [Right: No] [Left: Edema] [Right: :] Calf Left: Right: Point of Measurement: 29 cm From Medial Instep 37.4 cm cm Ankle Left: Right: Point of Measurement: 14 cm From Medial Instep 23 cm cm Vascular  Assessment Claudication: Claudication Assessment [Left:None] Pulses: Dorsalis Pedis Palpable: [Left:Yes] Posterior Tibial Extremity colors, hair growth, and conditions: Extremity Color: [Left:Normal] Hair Growth on Extremity: [Left:No] Temperature of Extremity: [Left:Warm] Capillary Refill: [Left:< 3 seconds] Toe Nail Assessment Left: Right: Thick: Yes Discolored: Yes Deformed: No Improper Length and Hygiene: No Electronic Signature(s) Signed: 11/28/2018 11:27:43 AM By: Secundino Ginger Entered By: Secundino Ginger on 11/28/2018 10:42:41 [name], Sheena A. ([id number]) -------------------------------------------------------------------------------- Multi Wound Chart Details Patient Name: Sheena Robert A. Date of Service: 11/28/2018 10:30 AM Medical Record Number: [id number] Patient Account Number: [id number] Date of Birth/Sex: 02-17-1941 (78 y.o. F) Treating RN: Montey Hora Primary Care  Lorynn Moeser: Glendon Axe Other Clinician: Referring Delane Stalling: Glendon Axe Treating Norissa Bartee/Extender: STONE III, HOYT Weeks in Treatment: 2 Vital Signs Height(in): 61 Pulse(bpm): 46 Weight(lbs): 201.3 Blood Pressure(mmHg): 144/69 Body Mass Index(BMI): 38 Temperature(F): 97.6 Respiratory Rate 16 (breaths/min): Photos: [N/A:N/A] Wound Location: Left Lower Leg - Midline, N/A N/A Anterior Wounding Event: Surgical Injury N/A N/A Primary Etiology: Diabetic Wound/Ulcer of the N/A N/A Lower Extremity Comorbid History: Cataracts, Anemia, N/A N/A Hypertension, Type II Diabetes, End Stage Renal Disease, Neuropathy Date Acquired: 08/01/2018 N/A N/A Weeks of Treatment: 2 N/A N/A Wound Status: Open N/A N/A Measurements L x W x D 0.7x0.7x0.2 N/A N/A (cm) Area (cm) : 0.385 N/A N/A Volume (cm) : 0.077 N/A N/A % Reduction in Area: 76.70% N/A N/A % Reduction in Volume: 84.40% N/A N/A Classification: Grade 2 N/A N/A Exudate Amount: Small N/A N/A Exudate Type: Serous N/A N/A Exudate Color:  amber N/A N/A Wound Margin: Thickened N/A N/A Granulation Amount: None Present (0%) N/A N/A Necrotic Amount: Large (67-100%) N/A N/A Exposed Structures: Fat Layer (Subcutaneous N/A N/A Tissue) Exposed: Yes Fascia: No Tendon: No [name], [name] ([id number]) Muscle: No Joint: No Bone: No Epithelialization: None N/A N/A Periwound Skin Texture: Excoriation: No N/A N/A Induration: No Callus: No Crepitus: No Rash: No Scarring: No Periwound Skin Moisture: Maceration: No N/A N/A Dry/Scaly: No Periwound Skin Color: Atrophie Blanche: No N/A N/A Cyanosis: No Ecchymosis: No Erythema: No Hemosiderin Staining: No Mottled: No Pallor: No Rubor: No Temperature: No Abnormality N/A N/A Tenderness on Palpation: Yes N/A N/A Wound Preparation: Ulcer Cleansing: N/A N/A Rinsed/Irrigated with Saline, Other: soap and water Topical Anesthetic Applied: Other: lidocaine 4% Treatment Notes Electronic Signature(s) Signed: 11/28/2018 4:47:13 PM By: Montey Hora Entered By: Montey Hora on 11/28/2018 11:17:55 Jerline Pain ([id number]) -------------------------------------------------------------------------------- Multi-Disciplinary Care Plan Details Patient Name: Sheena Robert A. Date of Service: 11/28/2018 10:30 AM Medical Record Number: [id number] Patient Account Number: [id number] Date of Birth/Sex: May 09, 1941 (78 y.o. F) Treating RN: Montey Hora Primary Care Nnamdi Dacus: Glendon Axe Other Clinician: Referring Sanari Offner: Glendon Axe Treating Mamie Diiorio/Extender: STONE III, HOYT Weeks in Treatment: 2 Active Inactive Nutrition Nursing Diagnoses: Imbalanced nutrition Goals: Patient/caregiver agrees to and verbalizes understanding of need to use nutritional supplements and/or vitamins as prescribed Date Initiated: 11/14/2018 Target Resolution Date: 02/03/2019 Goal Status: Active Interventions: Assess patient nutrition upon admission and as needed per  policy Notes: Orientation to the Wound Care Program Nursing Diagnoses: Knowledge deficit related to the wound healing center program Goals: Patient/caregiver will verbalize understanding of the [place] Program Date Initiated: 11/14/2018 Target Resolution Date: 02/03/2019 Goal Status: Active Interventions: Provide education on orientation to the wound center Notes: Pain, Acute or Chronic Nursing Diagnoses: Pain, acute or chronic: actual or potential Goals: Patient will verbalize adequate pain control and receive pain control interventions during procedures as needed Date Initiated: 11/14/2018 Target Resolution Date: 02/03/2019 Goal Status: Active Interventions: Complete pain assessment as per visit requirements Finau, Asma A. ([id number]) Notes: Wound/Skin Impairment Nursing Diagnoses: Impaired tissue integrity Goals: Ulcer/skin breakdown will heal within 14 weeks Date Initiated: 11/14/2018 Target Resolution Date: 02/03/2019 Goal Status: Active Interventions: Assess patient/caregiver ability to obtain necessary supplies Assess patient/caregiver ability to perform ulcer/skin care regimen upon admission and as needed Assess ulceration(s) every visit Notes: Electronic Signature(s) Signed: 11/28/2018 4:47:13 PM By: Montey Hora Entered By: Montey Hora on 11/28/2018 11:17:46 Leeder, [place]. ([id number]) -------------------------------------------------------------------------------- Pain Assessment Details Patient Name: Sheena Robert A. Date of Service: 11/28/2018 10:30 AM Medical Record Number: [id number] Patient Account Number: [id number] Date of  Birth/Sex: 1941/03/30 (78 y.o. F) Treating RN: Montey Hora Primary Care Payeton Germani: Glendon Axe Other Clinician: Referring Emmaly Leech: Glendon Axe Treating Bela Nyborg/Extender: STONE III, HOYT Weeks in Treatment: 2 Active Problems Location of Pain Severity and Description of Pain Patient Has Paino  No Site Locations Pain Management and Medication Current Pain Management: Electronic Signature(s) Signed: 11/28/2018 4:05:03 PM By: Paulla Fore, RRT, CHT Signed: 11/28/2018 4:47:13 PM By: Montey Hora Entered By: Lorine Bears on 11/28/2018 10:32:42 Jerline Pain ([id number]) -------------------------------------------------------------------------------- Patient/Caregiver Education Details Patient Name: Sheena Robert A. Date of Service: 11/28/2018 10:30 AM Medical Record Number: [id number] Patient Account Number: [id number] Date of Birth/Gender: [birth year] (78 y.o. F) Treating RN: Montey Hora Primary Care Physician: Glendon Axe Other Clinician: Referring Physician: Glendon Axe Treating Physician/Extender: Sharalyn Ink in Treatment: 2 Education Assessment Education Provided To: Patient Education Topics Provided Venous: Handouts: Other: leg elevation and wrap precautions Methods: Explain/Verbal Responses: State content correctly Electronic Signature(s) Signed: 11/28/2018 4:47:13 PM By: Montey Hora Entered By: Montey Hora on 11/28/2018 11:20:14 Deoliveira, Lace A. ([id number]) -------------------------------------------------------------------------------- Wound Assessment Details Patient Name: Sheena Robert A. Date of Service: 11/28/2018 10:30 AM Medical Record Number: [id number] Patient Account Number: [id number] Date of Birth/Sex: 1940/11/08 (78 y.o. F) Treating RN: Secundino Ginger Primary Care Zyere Jiminez: Glendon Axe Other Clinician: Referring Trystyn Dolley: Glendon Axe Treating Marciel Offenberger/Extender: STONE III, HOYT Weeks in Treatment: 2 Wound Status Wound Number: 1 Primary Diabetic Wound/Ulcer of the Lower Extremity Etiology: Wound Location: Left Lower Leg - Midline, Anterior Wound Open Wounding Event: Surgical Injury Status: Date Acquired: 08/01/2018 Comorbid Cataracts, Anemia, Hypertension, Type II Weeks  Of Treatment: 2 History: Diabetes, End Stage Renal Disease, Clustered Wound: No Neuropathy Photos Photo Uploaded By: Secundino Ginger on 11/28/2018 10:44:27 Wound Measurements Length: (cm) 0.7 Width: (cm) 0.7 Depth: (cm) 0.2 Area: (cm) 0.385 Volume: (cm) 0.077 % Reduction in Area: 76.7% % Reduction in Volume: 84.4% Epithelialization: None Tunneling: No Undermining: No Wound Description Classification: Grade 2 Wound Margin: Thickened Exudate Amount: Small Exudate Type: Serous Exudate Color: amber Foul Odor After Cleansing: No Slough/Fibrino Yes Wound Bed Granulation Amount: None Present (0%) Exposed Structure Necrotic Amount: Large (67-100%) Fascia Exposed: No Necrotic Quality: Adherent Slough Fat Layer (Subcutaneous Tissue) Exposed: Yes Tendon Exposed: No Muscle Exposed: No Joint Exposed: No Bone Exposed: No Periwound Skin Texture Enslin, Ariaunna A. ([id number]) Texture Color No Abnormalities Noted: Yes No Abnormalities Noted: No Atrophie Blanche: No Moisture Cyanosis: No No Abnormalities Noted: Yes Ecchymosis: No Erythema: No Hemosiderin Staining: No Mottled: No Pallor: No Rubor: No Temperature / Pain Temperature: No Abnormality Tenderness on Palpation: Yes Wound Preparation Ulcer Cleansing: Rinsed/Irrigated with Saline, Other: soap and water, Topical Anesthetic Applied: Other: lidocaine 4%, Treatment Notes Wound #1 (Left, Midline, Anterior Lower Leg) Notes prisma, ABD, 3 layer wrap with unna to anchor Electronic Signature(s) Signed: 11/28/2018 11:27:43 AM By: Secundino Ginger Entered By: Secundino Ginger on 11/28/2018 10:39:51 Swendsen, Andrey A. ([id number]) -------------------------------------------------------------------------------- Vitals Details Patient Name: Sheena Robert A. Date of Service: 11/28/2018 10:30 AM Medical Record Number: [id number] Patient Account Number: [id number] Date of Birth/Sex: 1941-04-17 (78 y.o. F) Treating RN: Montey Hora Primary Care Baby Stairs: Glendon Axe Other Clinician: Referring Sherly Brodbeck: Glendon Axe Treating Juri Dinning/Extender: STONE III, HOYT Weeks in Treatment: 2 Vital Signs Time Taken: 10:32 Temperature (F): 97.6 Height (in): 61 Pulse (bpm): 82 Weight (lbs): 201.3 Respiratory Rate (breaths/min): 16 Body Mass Index (BMI): 38 Blood Pressure (mmHg): 144/69 Reference Range: 80 - 120 mg / dl Airway Electronic Signature(s) Signed: 11/28/2018 4:05:03 PM By: Becky Sax,  Sallie RCP, RRT, CHT Entered By: Lorine Bears on 11/28/2018 10:35:15

## 2018-12-01 NOTE — Progress Notes (Signed)
Sheena [name] ([id number]) Visit Report for 11/28/2018 Chief Complaint Document Details Patient Name: Sheena [name], Sheena A. Date of Service: 11/28/2018 10:30 AM Medical Record Number: [id number] Patient Account Number: [id number] Date of Birth/Sex: Jul 31, 1941 (78 y.o. F) Treating RN: Montey Hora Primary Care Provider: Glendon Axe Other Clinician: Referring Provider: Glendon Axe Treating Provider/Extender: Melburn Hake, HOYT Weeks in Treatment: 2 Information Obtained from: Patient Chief Complaint Left leg ulcer Electronic Signature(s) Signed: 11/30/2018 1:01:02 AM By: Worthy Keeler PA-C Entered By: Worthy Keeler on 11/28/2018 10:41:00 Sheena Robert A. ([id number]) -------------------------------------------------------------------------------- HPI Details Patient Name: Sheena Robert A. Date of Service: 11/28/2018 10:30 AM Medical Record Number: [id number] Patient Account Number: [id number] Date of Birth/Sex: 11/28/1940 (78 y.o. F) Treating RN: Montey Hora Primary Care Provider: Glendon Axe Other Clinician: Referring Provider: Glendon Axe Treating Provider/Extender: Melburn Hake, HOYT Weeks in Treatment: 2 History of Present Illness HPI Description: 11/14/18 on evaluation today patient presents for initial evaluation our clinic concerning issues that she is having with her left lower extremity secondary to a biopsy which was performed on August 01, 2018. This area come back as an actinic keratosis and was not cancerous. With that being said she's had a very difficult time getting this we can heal although she states and last month it has been somewhat better than previous. Fortunately there does not appear to be any signs of infection. She did have recommendations from the dermatologist to do vinegar soaks twice a day along with applying Bactroban following. She is a diabetic type II with her most recent reported hemoglobin A1c of 6.7 this is from the patient  we did not have record of this. She also has hypertension and chronic kidney disease stage III along with hypothyroidism. No fevers, chills, nausea, or vomiting noted at this time. 11/21/18 on evaluation today patient does appear to be showing signs of improvement which is good news. She has been tolerating the dressing changes without complication. Fortunately there does not appear to be any sign of infection which is also great news. With that being said she does have some swelling of the lower extremity. This is a little bit more than what she's used to having for the most part. She is having no [name] in hopes that I do not have to do a lot in the way of debridement today. 11/28/18 on evaluation today patient appears to be doing excellent in regard to her wound. In fact it doesn't even appear that we're gonna require any debridement today which is excellent news. She has signed so good improvement and no evidence of infection. No fevers, chills, nausea, or vomiting noted at this time. Electronic Signature(s) Signed: 11/30/2018 1:01:02 AM By: Worthy Keeler PA-C Entered By: Worthy Keeler on 11/30/2018 00:48:29 Sheena [name] ([id number]) -------------------------------------------------------------------------------- Physical Exam Details Patient Name: Sheena Robert A. Date of Service: 11/28/2018 10:30 AM Medical Record Number: [id number] Patient Account Number: [id number] Date of Birth/Sex: Jun 01, 1941 (78 y.o. F) Treating RN: Montey Hora Primary Care Provider: Glendon Axe Other Clinician: Referring Provider: Glendon Axe Treating Provider/Extender: STONE III, HOYT Weeks in Treatment: 2 Constitutional Well-nourished and well-hydrated in no acute distress. Respiratory normal breathing without difficulty. clear to auscultation bilaterally. Cardiovascular regular rate and rhythm with normal S1, S2. Psychiatric this patient is able to make decisions and demonstrates good  insight into disease process. Alert and Oriented x 3. pleasant and cooperative. Notes Patient's wound bed currently again shows good granulation and epithelialization around the edges. There's no signs of infection actively  at this time which is excellent news as well. Overall I think she is doing excellent. Electronic Signature(s) Signed: 11/30/2018 1:01:02 AM By: Worthy Keeler PA-C Entered By: Worthy Keeler on 11/30/2018 00:49:48 Sheena [name] ([id number]) -------------------------------------------------------------------------------- Physician Orders Details Patient Name: Sheena Robert A. Date of Service: 11/28/2018 10:30 AM Medical Record Number: [id number] Patient Account Number: [id number] Date of Birth/Sex: 1941-05-13 (78 y.o. F) Treating RN: Montey Hora Primary Care Provider: Glendon Axe Other Clinician: Referring Provider: Glendon Axe Treating Provider/Extender: Melburn Hake, HOYT Weeks in Treatment: 2 Verbal / Phone Orders: No Diagnosis Coding ICD-10 Coding Code Description E11.622 Type 2 diabetes mellitus with other skin ulcer L97.822 Non-pressure chronic ulcer of other part of left lower leg with fat layer exposed I10 Essential (primary) hypertension N18.3 Chronic kidney disease, stage 3 (moderate) Wound Cleansing Wound #1 Left,Midline,Anterior Lower Leg o Clean wound with Normal Saline. o May shower with protection. - PLEASE DO NOT GET YOUR LEG WRAP WET AND CALL us IF YOU DO Anesthetic (add to Medication List) Wound #1 Left,Midline,Anterior Lower Leg o Topical Lidocaine 4% cream applied to wound bed prior to debridement (In Clinic Only). Primary Wound Dressing Wound #1 Left,Midline,Anterior Lower Leg o Silver Collagen - moisten with saline Secondary Dressing Wound #1 Left,Midline,Anterior Lower Leg o ABD pad Dressing Change Frequency Wound #1 Left,Midline,Anterior Lower Leg o Change dressing every week Follow-up Appointments Wound  #1 Left,Midline,Anterior Lower Leg o Return Appointment in 1 week. Edema Control Wound #1 Left,Midline,Anterior Lower Leg o 3 Layer Compression System - Left Lower Extremity o Elevate legs to the level of the heart and pump ankles as often as possible Electronic Signature(s) Signed: 11/28/2018 4:47:13 PM By: Joline Salt, Redith A. ([id number]) Signed: 11/30/2018 1:01:02 AM By: Worthy Keeler PA-C Entered By: Montey Hora on 11/28/2018 11:19:18 Sheena [name], Sheena A. ([id number]) -------------------------------------------------------------------------------- Problem List Details Patient Name: Sheena Robert A. Date of Service: 11/28/2018 10:30 AM Medical Record Number: [id number] Patient Account Number: [id number] Date of Birth/Sex: August 24, 1941 (78 y.o. F) Treating RN: Montey Hora Primary Care Provider: Glendon Axe Other Clinician: Referring Provider: Glendon Axe Treating Provider/Extender: Melburn Hake, HOYT Weeks in Treatment: 2 Active Problems ICD-10 Evaluated Encounter Code Description Active Date Today Diagnosis E11.622 Type 2 diabetes mellitus with other skin ulcer 11/14/2018 No Yes L97.822 Non-pressure chronic ulcer of other part of left lower leg with 11/14/2018 No Yes fat layer exposed I10 Essential (primary) hypertension 11/14/2018 No Yes N18.3 Chronic kidney disease, stage 3 (moderate) 11/14/2018 No Yes Inactive Problems Resolved Problems Electronic Signature(s) Signed: 11/30/2018 1:01:02 AM By: Worthy Keeler PA-C Entered By: Worthy Keeler on 11/28/2018 10:39:53 Sheena [name], Sheena A. ([id number]) -------------------------------------------------------------------------------- Progress Note Details Patient Name: Sheena Robert A. Date of Service: 11/28/2018 10:30 AM Medical Record Number: [id number] Patient Account Number: [id number] Date of Birth/Sex: December 25, 1940 (78 y.o. F) Treating RN: Montey Hora Primary Care Provider: Glendon Axe Other  Clinician: Referring Provider: Glendon Axe Treating Provider/Extender: Melburn Hake, HOYT Weeks in Treatment: 2 Subjective Chief Complaint Information obtained from Patient Left leg ulcer History of Present Illness (HPI) 11/14/18 on evaluation today patient presents for initial evaluation our clinic concerning issues that she is having with her left lower extremity secondary to a biopsy which was performed on August 01, 2018. This area come back as an actinic keratosis and was not cancerous. With that being said she's had a very difficult time getting this we can heal although she states and last month it has been somewhat better than previous. Fortunately there does not  appear to be any signs of infection. She did have recommendations from the dermatologist to do vinegar soaks twice a day along with applying Bactroban following. She is a diabetic type II with her most recent reported hemoglobin A1c of 6.7 this is from the patient we did not have record of this. She also has hypertension and chronic kidney disease stage III along with hypothyroidism. No fevers, chills, nausea, or vomiting noted at this time. 11/21/18 on evaluation today patient does appear to be showing signs of improvement which is good news. She has been tolerating the dressing changes without complication. Fortunately there does not appear to be any sign of infection which is also great news. With that being said she does have some swelling of the lower extremity. This is a little bit more than what she's used to having for the most part. She is having no [name] in hopes that I do not have to do a lot in the way of debridement today. 11/28/18 on evaluation today patient appears to be doing excellent in regard to her wound. In fact it doesn't even appear that we're gonna require any debridement today which is excellent news. She has signed so good improvement and no evidence of infection. No fevers, chills, nausea, or vomiting  noted at this time. Patient History Information obtained from Patient. Family History Diabetes - Paternal Grandparents, Hypertension - Father, Stroke - Father, No family history of Cancer, Heart Disease, Hereditary Spherocytosis, Kidney Disease, Lung Disease, Seizures, Thyroid Problems, Tuberculosis. Social History Never smoker, Marital Status - Married, Alcohol Use - Never, Drug Use - No History, Caffeine Use - Moderate. Medical History Eyes Patient has history of Cataracts - Both removed Denies history of Glaucoma Ear/Nose/Mouth/Throat Denies history of Chronic sinus problems/congestion, Middle ear problems Hematologic/Lymphatic Patient has history of Anemia Denies history of Hemophilia, Human Immunodeficiency Virus, Lymphedema, Sickle Cell Disease Respiratory Trautner, Mirranda A. ([id number]) Denies history of Aspiration, Asthma, Chronic Obstructive Pulmonary Disease (COPD), Pneumothorax, Sleep Apnea, Tuberculosis Cardiovascular Patient has history of Hypertension - Medication controlled Denies history of Angina, Arrhythmia, Congestive Heart Failure, Coronary Artery Disease, Deep Vein Thrombosis, Hypotension, Myocardial Infarction, Peripheral Arterial Disease, Peripheral Venous Disease, Phlebitis, Vasculitis Gastrointestinal Denies history of Cirrhosis , Colitis, Crohn s, Hepatitis A, Hepatitis B, Hepatitis C Endocrine Patient has history of Type II Diabetes Denies history of Type I Diabetes Genitourinary Patient has history of End Stage Renal Disease - Stage 3 Immunological Denies history of Lupus Erythematosus, Raynaud s, Scleroderma Integumentary (Skin) Denies history of History of Burn, History of pressure wounds Musculoskeletal Denies history of Gout, Rheumatoid Arthritis, Osteoarthritis, Osteomyelitis Neurologic Patient has history of Neuropathy Denies history of Dementia, Quadriplegia, Paraplegia, Seizure Disorder Oncologic Denies history of Received Chemotherapy,  Received Radiation Psychiatric Denies history of Anorexia/bulimia, Confinement Anxiety Review of Systems (ROS) Constitutional Symptoms (General Health) Denies complaints or symptoms of Fever, Chills. Respiratory The patient has no complaints or symptoms. Cardiovascular The patient has no complaints or symptoms. Psychiatric The patient has no complaints or symptoms. Objective Constitutional Well-nourished and well-hydrated in no acute distress. Vitals Time Taken: 10:32 AM, Height: 61 in, Weight: 201.3 lbs, BMI: 38, Temperature: 97.6 F, Pulse: 82 bpm, Respiratory Rate: 16 breaths/min, Blood Pressure: 144/69 mmHg. Respiratory normal breathing without difficulty. clear to auscultation bilaterally. Cardiovascular regular rate and rhythm with normal S1, S2. Sheena [name], Sheena A. ([id number]) Psychiatric this patient is able to make decisions and demonstrates good insight into disease process. Alert and Oriented x 3. pleasant and cooperative. General Notes: Patient's wound bed  currently again shows good granulation and epithelialization around the edges. There's no signs of infection actively at this time which is excellent news as well. Overall I think she is doing excellent. Integumentary (Hair, Skin) Wound #1 status is Open. Original cause of wound was Surgical Injury. The wound is located on the Left,Midline,Anterior Lower Leg. The wound measures 0.7cm length x 0.7cm width x 0.2cm depth; 0.385cm^2 area and 0.077cm^3 volume. There is Fat Layer (Subcutaneous Tissue) Exposed exposed. There is no tunneling or undermining noted. There is a small amount of serous drainage noted. The wound margin is thickened. There is no granulation within the wound bed. There is a large (67- 100%) amount of necrotic tissue within the wound bed including Adherent Slough. The periwound skin appearance had no abnormalities noted for texture. The periwound skin appearance had no abnormalities noted for moisture.  The periwound skin appearance did not exhibit: Atrophie Blanche, Cyanosis, Ecchymosis, Hemosiderin Staining, Mottled, Pallor, Rubor, Erythema. Periwound temperature was noted as No Abnormality. The periwound has tenderness on palpation. Assessment Active Problems ICD-10 Type 2 diabetes mellitus with other skin ulcer Non-pressure chronic ulcer of other part of left lower leg with fat layer exposed Essential (primary) hypertension Chronic kidney disease, stage 3 (moderate) Procedures Wound #1 Pre-procedure diagnosis of Wound #1 is a Diabetic Wound/Ulcer of the Lower Extremity located on the Left,Midline,Anterior Lower Leg . There was a Three Layer Compression Therapy Procedure with a pre-treatment ABI of 1.1 by Montey Hora, RN. Post procedure Diagnosis Wound #1: Same as Pre-Procedure Plan Wound Cleansing: Wound #1 Left,Midline,Anterior Lower Leg: Clean wound with Normal Saline. May shower with protection. - PLEASE DO NOT GET YOUR LEG WRAP WET AND CALL us IF YOU DO Anesthetic (add to Medication List): Wound #1 Left,Midline,Anterior Lower Leg: Sheena [name], Sheena A. ([id number]) Topical Lidocaine 4% cream applied to wound bed prior to debridement (In Clinic Only). Primary Wound Dressing: Wound #1 Left,Midline,Anterior Lower Leg: Silver Collagen - moisten with saline Secondary Dressing: Wound #1 Left,Midline,Anterior Lower Leg: ABD pad Dressing Change Frequency: Wound #1 Left,Midline,Anterior Lower Leg: Change dressing every week Follow-up Appointments: Wound #1 Left,Midline,Anterior Lower Leg: Return Appointment in 1 week. Edema Control: Wound #1 Left,Midline,Anterior Lower Leg: 3 Layer Compression System - Left Lower Extremity Elevate legs to the level of the heart and pump ankles as often as possible My suggestion is gonna be that we continue with the compression wrap which is doing well for her the patient's in agreement with doing so reluctantly. I do think however it's been  helpful for her. We will subsequently see were things stand at follow-up. Please see above for specific wound care orders. We will see patient for re-evaluation in 1 week(s) here in the clinic. If anything worsens or changes patient will contact our office for additional recommendations. Electronic Signature(s) Signed: 11/30/2018 1:01:02 AM By: Worthy Keeler PA-C Entered By: Worthy Keeler on 11/30/2018 00:50:01 Sheena [name] ([id number]) -------------------------------------------------------------------------------- ROS/PFSH Details Patient Name: Sheena Robert A. Date of Service: 11/28/2018 10:30 AM Medical Record Number: [id number] Patient Account Number: [id number] Date of Birth/Sex: [birth year] (78 y.o. F) Treating RN: Montey Hora Primary Care Provider: Glendon Axe Other Clinician: Referring Provider: Glendon Axe Treating Provider/Extender: STONE III, HOYT Weeks in Treatment: 2 Information Obtained From Patient Wound History Do you currently have one or more open woundso Yes How many open wounds do you currently haveo 1 Approximately how long have you had your woundso october How have you been treating your wound(s) until nowo Mupirocin Has your wound(s)  ever healed and then re-openedo No Have you had any lab work done in the past montho No Have you tested positive for an antibiotic resistant organism (MRSA, VRE)o No Have you tested positive for osteomyelitis (bone infection)o No Have you had any tests for circulation on your legso No Constitutional Symptoms (General Health) Complaints and Symptoms: Negative for: Fever; Chills Eyes Medical History: Positive for: Cataracts - Both removed Negative for: Glaucoma Ear/Nose/Mouth/Throat Medical History: Negative for: Chronic sinus problems/congestion; Middle ear problems Hematologic/Lymphatic Medical History: Positive for: Anemia Negative for: Hemophilia; Human Immunodeficiency Virus; Lymphedema; Sickle Cell  Disease Respiratory Complaints and Symptoms: No Complaints or Symptoms Medical History: Negative for: Aspiration; Asthma; Chronic Obstructive Pulmonary Disease (COPD); Pneumothorax; Sleep Apnea; Tuberculosis Cardiovascular Complaints and Symptoms: No Complaints or Symptoms Sheena [name], Sheena A. ([id number]) Medical History: Positive for: Hypertension - Medication controlled Negative for: Angina; Arrhythmia; Congestive Heart Failure; Coronary Artery Disease; Deep Vein Thrombosis; Hypotension; Myocardial Infarction; Peripheral Arterial Disease; Peripheral Venous Disease; Phlebitis; Vasculitis Gastrointestinal Medical History: Negative for: Cirrhosis ; Colitis; Crohnos; Hepatitis A; Hepatitis B; Hepatitis C Endocrine Medical History: Positive for: Type II Diabetes Negative for: Type I Diabetes Time with diabetes: 20 Treated with: Oral agents Blood sugar tested every day: No Genitourinary Medical History: Positive for: End Stage Renal Disease - Stage 3 Immunological Medical History: Negative for: Lupus Erythematosus; Raynaudos; Scleroderma Integumentary (Skin) Medical History: Negative for: History of Burn; History of pressure wounds Musculoskeletal Medical History: Negative for: Gout; Rheumatoid Arthritis; Osteoarthritis; Osteomyelitis Neurologic Medical History: Positive for: Neuropathy Negative for: Dementia; Quadriplegia; Paraplegia; Seizure Disorder Oncologic Medical History: Negative for: Received Chemotherapy; Received Radiation Psychiatric Complaints and Symptoms: No Complaints or Symptoms Medical History: Negative for: Anorexia/bulimia; Confinement Anxiety Sheena [name], Sheena A. ([id number]) HBO Extended History Items Eyes: Cataracts Immunizations Pneumococcal Vaccine: Received Pneumococcal Vaccination: Yes Implantable Devices Family and Social History Cancer: No; Diabetes: Yes - Paternal Grandparents; Heart Disease: No; Hereditary Spherocytosis: No;  Hypertension: Yes - Father; Kidney Disease: No; Lung Disease: No; Seizures: No; Stroke: Yes - Father; Thyroid Problems: No; Tuberculosis: No; Never smoker; Marital Status - Married; Alcohol Use: Never; Drug Use: No History; Caffeine Use: Moderate; Advanced Directives: No; Patient does not want information on Advanced Directives; Do not resuscitate: No; Living Will: No; Medical Power of Attorney: No Physician Affirmation I have reviewed and agree with the above information. Electronic Signature(s) Signed: 11/30/2018 1:01:02 AM By: Worthy Keeler PA-C Signed: 11/30/2018 5:03:44 PM By: Montey Hora Entered By: Worthy Keeler on 11/30/2018 00:49:18 Sheena [name] ([id number]) -------------------------------------------------------------------------------- SuperBill Details Patient Name: Sheena Robert A. Date of Service: 11/28/2018 Medical Record Number: [id number] Patient Account Number: [id number] Date of Birth/Sex: 1941/02/26 (78 y.o. F) Treating RN: Montey Hora Primary Care Provider: Glendon Axe Other Clinician: Referring Provider: Glendon Axe Treating Provider/Extender: Melburn Hake, HOYT Weeks in Treatment: 2 Diagnosis Coding ICD-10 Codes Code Description E11.622 Type 2 diabetes mellitus with other skin ulcer L97.822 Non-pressure chronic ulcer of other part of left lower leg with fat layer exposed I10 Essential (primary) hypertension N18.3 Chronic kidney disease, stage 3 (moderate) Facility Procedures CPT4 Code: [id number] Description: (Facility Use Only) [id number] - APPLY MULTLAY COMPRS LWR LT LEG Modifier: Quantity: 1 Physician Procedures CPT4 Code Description: [id number] - WC PHYS LEVEL 4 - EST PT ICD-10 Diagnosis Description E11.622 Type 2 diabetes mellitus with other skin ulcer L97.822 Non-pressure chronic ulcer of other part of left lower leg wit I10 Essential (primary)  hypertension N18.3 Chronic kidney disease, stage 3 (moderate) Modifier: h fat layer  expos Quantity: 1  ed Electronic Signature(s) Signed: 11/30/2018 1:01:02 AM By: Worthy Keeler PA-C Previous Signature: 11/28/2018 4:47:13 PM Version By: Montey Hora Entered By: Worthy Keeler on 11/28/2018 23:58:11

## 2018-12-05 ENCOUNTER — Encounter: Payer: PPO | Attending: Physician Assistant | Admitting: Physician Assistant

## 2018-12-05 DIAGNOSIS — E1140 Type 2 diabetes mellitus with diabetic neuropathy, unspecified: Secondary | ICD-10-CM | POA: Diagnosis not present

## 2018-12-05 DIAGNOSIS — T8189XA Other complications of procedures, not elsewhere classified, initial encounter: Secondary | ICD-10-CM | POA: Diagnosis not present

## 2018-12-05 DIAGNOSIS — E11621 Type 2 diabetes mellitus with foot ulcer: Secondary | ICD-10-CM | POA: Insufficient documentation

## 2018-12-05 DIAGNOSIS — N186 End stage renal disease: Secondary | ICD-10-CM | POA: Diagnosis not present

## 2018-12-05 DIAGNOSIS — Z833 Family history of diabetes mellitus: Secondary | ICD-10-CM | POA: Diagnosis not present

## 2018-12-05 DIAGNOSIS — E039 Hypothyroidism, unspecified: Secondary | ICD-10-CM | POA: Insufficient documentation

## 2018-12-05 DIAGNOSIS — E1122 Type 2 diabetes mellitus with diabetic chronic kidney disease: Secondary | ICD-10-CM | POA: Diagnosis not present

## 2018-12-05 DIAGNOSIS — Z823 Family history of stroke: Secondary | ICD-10-CM | POA: Insufficient documentation

## 2018-12-05 DIAGNOSIS — Z8249 Family history of ischemic heart disease and other diseases of the circulatory system: Secondary | ICD-10-CM | POA: Diagnosis not present

## 2018-12-05 DIAGNOSIS — E11622 Type 2 diabetes mellitus with other skin ulcer: Secondary | ICD-10-CM | POA: Diagnosis not present

## 2018-12-05 DIAGNOSIS — L97822 Non-pressure chronic ulcer of other part of left lower leg with fat layer exposed: Secondary | ICD-10-CM | POA: Diagnosis not present

## 2018-12-05 DIAGNOSIS — I120 Hypertensive chronic kidney disease with stage 5 chronic kidney disease or end stage renal disease: Secondary | ICD-10-CM | POA: Insufficient documentation

## 2018-12-06 NOTE — Progress Notes (Signed)
JNAI, SNELLGROVE ([id number]) Visit Report for 12/05/2018 Chief Complaint Document Details Patient Name: Sheena [name], Sheena A. Date of Service: 12/05/2018 10:30 AM Medical Record Number: [id number] Patient Account Number: [id number] Date of Birth/Sex: Jan 15, 1941 (78 y.o. F) Treating RN: Montey Hora Primary Care Provider: Glendon Axe Other Clinician: Referring Provider: Glendon Axe Treating Provider/Extender: Melburn Hake, HOYT Weeks in Treatment: 3 Information Obtained from: Patient Chief Complaint Left leg ulcer Electronic Signature(s) Signed: 12/05/2018 5:55:30 PM By: Worthy Keeler PA-C Entered By: Worthy Keeler on 12/05/2018 11:30:51 Sheena [name], Sheena A. ([id number]) -------------------------------------------------------------------------------- Debridement Details Patient Name: Sheena Robert A. Date of Service: 12/05/2018 10:30 AM Medical Record Number: [id number] Patient Account Number: [id number] Date of Birth/Sex: 18-Mar-1941 (78 y.o. F) Treating RN: Montey Hora Primary Care Provider: Glendon Axe Other Clinician: Referring Provider: Glendon Axe Treating Provider/Extender: STONE III, HOYT Weeks in Treatment: 3 Debridement Performed for Wound #1 Left,Midline,Anterior Lower Leg Assessment: Performed By: Physician STONE III, HOYT E., PA-C Debridement Type: Debridement Severity of Tissue Pre Fat layer exposed Debridement: Level of Consciousness (Pre- Awake and Alert procedure): Pre-procedure Verification/Time Yes - 11:34 Out Taken: Start Time: 11:34 [name] Control: Lidocaine 4% Topical Solution Total Area Debrided (L x W): 0.6 (cm) x 0.5 (cm) = 0.3 (cm) Tissue and other material Non-Viable, Skin: Epidermis, Fibrin/Exudate, Other: adhered dressing debrided: Level: Skin/Epidermis Debridement Description: Selective/Open Wound Instrument: Curette Bleeding: None End Time: 11:36 Procedural [name]: 0 Post Procedural [name]: 0 Response to Treatment: Procedure  was tolerated well Level of Consciousness Awake and Alert (Post-procedure): Post Debridement Measurements of Total Wound Length: (cm) 0.6 Width: (cm) 0.5 Depth: (cm) 0.1 Volume: (cm) 0.024 Character of Wound/Ulcer Post Debridement: Improved Severity of Tissue Post Debridement: Fat layer exposed Post Procedure Diagnosis Same as Pre-procedure Electronic Signature(s) Signed: 12/05/2018 4:52:45 PM By: Montey Hora Signed: 12/05/2018 5:55:30 PM By: Worthy Keeler PA-C Entered By: Montey Hora on 12/05/2018 11:36:50 Sheena [name], Sheena A. ([id number]) -------------------------------------------------------------------------------- HPI Details Patient Name: Sheena Robert A. Date of Service: 12/05/2018 10:30 AM Medical Record Number: [id number] Patient Account Number: [id number] Date of Birth/Sex: [birth year] (78 y.o. F) Treating RN: Montey Hora Primary Care Provider: Glendon Axe Other Clinician: Referring Provider: Glendon Axe Treating Provider/Extender: Melburn Hake, HOYT Weeks in Treatment: 3 History of Present Illness HPI Description: 11/14/18 on evaluation today patient presents for initial evaluation our clinic concerning issues that she is having with her left lower extremity secondary to a biopsy which was performed on August 01, 2018. This area come back as an actinic keratosis and was not cancerous. With that being said she's had a very difficult time getting this we can heal although she states and last month it has been somewhat better than previous. Fortunately there does not appear to be any signs of infection. She did have recommendations from the dermatologist to do vinegar soaks twice a day along with applying Bactroban following. She is a diabetic type II with her most recent reported hemoglobin A1c of 6.7 this is from the patient we did not have record of this. She also has hypertension and chronic kidney disease stage III along with hypothyroidism. No fevers,  chills, nausea, or vomiting noted at this time. 11/21/18 on evaluation today patient does appear to be showing signs of improvement which is good news. She has been tolerating the dressing changes without complication. Fortunately there does not appear to be any sign of infection which is also great news. With that being said she does have some swelling of the lower extremity. This is a little bit  more than what she's used to having for the most part. She is having no [name] in hopes that I do not have to do a lot in the way of debridement today. 11/28/18 on evaluation today patient appears to be doing excellent in regard to her wound. In fact it doesn't even appear that we're gonna require any debridement today which is excellent news. She has signed so good improvement and no evidence of infection. No fevers, chills, nausea, or vomiting noted at this time. 12/05/18 on evaluation today patient appears to be doing better in regard to the lower extremity ulcer. She has been tolerating the dressing changes without complication. Fortunately there's no signs of infection. With that being said this is very close to complete closure at this point. Electronic Signature(s) Signed: 12/05/2018 5:55:30 PM By: Worthy Keeler PA-C Entered By: Worthy Keeler on 12/05/2018 13:23:08 Sheena [name] ([id number]) -------------------------------------------------------------------------------- Physical Exam Details Patient Name: Sheena Robert A. Date of Service: 12/05/2018 10:30 AM Medical Record Number: [id number] Patient Account Number: [id number] Date of Birth/Sex: April 20, 1941 (78 y.o. F) Treating RN: Montey Hora Primary Care Provider: Glendon Axe Other Clinician: Referring Provider: Glendon Axe Treating Provider/Extender: STONE III, HOYT Weeks in Treatment: 3 Constitutional Well-nourished and well-hydrated in no acute distress. Respiratory normal breathing without difficulty. Psychiatric this  patient is able to make decisions and demonstrates good insight into disease process. Alert and Oriented x 3. pleasant and cooperative. Notes Patient's wound currently did require sharp debridement today and she tolerated this without complication. With that being said really that was just extra date and dressing material removed from the edge of the wound otherwise this appears to be doing very well. Electronic Signature(s) Signed: 12/05/2018 5:55:30 PM By: Worthy Keeler PA-C Entered By: Worthy Keeler on 12/05/2018 13:23:45 Sheena [name] ([id number]) -------------------------------------------------------------------------------- Physician Orders Details Patient Name: Sheena Robert A. Date of Service: 12/05/2018 10:30 AM Medical Record Number: [id number] Patient Account Number: [id number] Date of Birth/Sex: Feb 05, 1941 (78 y.o. F) Treating RN: Montey Hora Primary Care Provider: Glendon Axe Other Clinician: Referring Provider: Glendon Axe Treating Provider/Extender: Melburn Hake, HOYT Weeks in Treatment: 3 Verbal / Phone Orders: No Diagnosis Coding ICD-10 Coding Code Description E11.622 Type 2 diabetes mellitus with other skin ulcer L97.822 Non-pressure chronic ulcer of other part of left lower leg with fat layer exposed I10 Essential (primary) hypertension N18.3 Chronic kidney disease, stage 3 (moderate) Wound Cleansing Wound #1 Left,Midline,Anterior Lower Leg o Clean wound with Normal Saline. o May Shower, gently pat wound dry prior to applying new dressing. Anesthetic (add to Medication List) Wound #1 Left,Midline,Anterior Lower Leg o Topical Lidocaine 4% cream applied to wound bed prior to debridement (In Clinic Only). Primary Wound Dressing Wound #1 Left,Midline,Anterior Lower Leg o Xeroform Secondary Dressing Wound #1 Left,Midline,Anterior Lower Leg o Boardered Foam Dressing Dressing Change Frequency Wound #1 Left,Midline,Anterior Lower Leg o  Change dressing every other day. Follow-up Appointments Wound #1 Left,Midline,Anterior Lower Leg o Return Appointment in 1 week. Edema Control Wound #1 Left,Midline,Anterior Lower Leg o Patient to wear own compression stockings Electronic Signature(s) Signed: 12/05/2018 4:52:45 PM By: Montey Hora Signed: 12/05/2018 5:55:30 PM By: Ronn Melena, Cheralyn A. ([id number]) Entered By: Montey Hora on 12/05/2018 11:38:18 Sheena [name], Brycelynn A. ([id number]) -------------------------------------------------------------------------------- Problem List Details Patient Name: Sheena Robert A. Date of Service: 12/05/2018 10:30 AM Medical Record Number: [id number] Patient Account Number: [id number] Date of Birth/Sex: [birth year] (78 y.o. F) Treating RN: Montey Hora Primary Care Provider: Glendon Axe Other Clinician: Referring  Provider: Glendon Axe Treating Provider/Extender: Melburn Hake, HOYT Weeks in Treatment: 3 Active Problems ICD-10 Evaluated Encounter Code Description Active Date Today Diagnosis E11.622 Type 2 diabetes mellitus with other skin ulcer 11/14/2018 No Yes L97.822 Non-pressure chronic ulcer of other part of left lower leg with 11/14/2018 No Yes fat layer exposed I10 Essential (primary) hypertension 11/14/2018 No Yes N18.3 Chronic kidney disease, stage 3 (moderate) 11/14/2018 No Yes Inactive Problems Resolved Problems Electronic Signature(s) Signed: 12/05/2018 5:55:30 PM By: Worthy Keeler PA-C Entered By: Worthy Keeler on 12/05/2018 11:30:03 Sheena [name], Sheena A. ([id number]) -------------------------------------------------------------------------------- Progress Note Details Patient Name: Sheena Robert A. Date of Service: 12/05/2018 10:30 AM Medical Record Number: [id number] Patient Account Number: [id number] Date of Birth/Sex: 1941/01/08 (78 y.o. F) Treating RN: Montey Hora Primary Care Provider: Glendon Axe Other Clinician: Referring  Provider: Glendon Axe Treating Provider/Extender: Melburn Hake, HOYT Weeks in Treatment: 3 Subjective Chief Complaint Information obtained from Patient Left leg ulcer History of Present Illness (HPI) 11/14/18 on evaluation today patient presents for initial evaluation our clinic concerning issues that she is having with her left lower extremity secondary to a biopsy which was performed on August 01, 2018. This area come back as an actinic keratosis and was not cancerous. With that being said she's had a very difficult time getting this we can heal although she states and last month it has been somewhat better than previous. Fortunately there does not appear to be any signs of infection. She did have recommendations from the dermatologist to do vinegar soaks twice a day along with applying Bactroban following. She is a diabetic type II with her most recent reported hemoglobin A1c of 6.7 this is from the patient we did not have record of this. She also has hypertension and chronic kidney disease stage III along with hypothyroidism. No fevers, chills, nausea, or vomiting noted at this time. 11/21/18 on evaluation today patient does appear to be showing signs of improvement which is good news. She has been tolerating the dressing changes without complication. Fortunately there does not appear to be any sign of infection which is also great news. With that being said she does have some swelling of the lower extremity. This is a little bit more than what she's used to having for the most part. She is having no [name] in hopes that I do not have to do a lot in the way of debridement today. 11/28/18 on evaluation today patient appears to be doing excellent in regard to her wound. In fact it doesn't even appear that we're gonna require any debridement today which is excellent news. She has signed so good improvement and no evidence of infection. No fevers, chills, nausea, or vomiting noted at this  time. 12/05/18 on evaluation today patient appears to be doing better in regard to the lower extremity ulcer. She has been tolerating the dressing changes without complication. Fortunately there's no signs of infection. With that being said this is very close to complete closure at this point. Patient History Information obtained from Patient. Family History Diabetes - Paternal Grandparents, Hypertension - Father, Stroke - Father, No family history of Cancer, Heart Disease, Hereditary Spherocytosis, Kidney Disease, Lung Disease, Seizures, Thyroid Problems, Tuberculosis. Social History Never smoker, Marital Status - Married, Alcohol Use - Never, Drug Use - No History, Caffeine Use - Moderate. Medical History Eyes Patient has history of Cataracts - Both removed Denies history of Glaucoma Ear/Nose/Mouth/Throat Denies history of Chronic sinus problems/congestion, Middle ear problems Sheena [name], Sheena A. ([id number]) Hematologic/Lymphatic Patient  has history of Anemia Denies history of Hemophilia, Human Immunodeficiency Virus, Lymphedema, Sickle Cell Disease Respiratory Denies history of Aspiration, Asthma, Chronic Obstructive Pulmonary Disease (COPD), Pneumothorax, Sleep Apnea, Tuberculosis Cardiovascular Patient has history of Hypertension - Medication controlled Denies history of Angina, Arrhythmia, Congestive Heart Failure, Coronary Artery Disease, Deep Vein Thrombosis, Hypotension, Myocardial Infarction, Peripheral Arterial Disease, Peripheral Venous Disease, Phlebitis, Vasculitis Gastrointestinal Denies history of Cirrhosis , Colitis, Crohn s, Hepatitis A, Hepatitis B, Hepatitis C Endocrine Patient has history of Type II Diabetes Denies history of Type I Diabetes Genitourinary Patient has history of End Stage Renal Disease - Stage 3 Immunological Denies history of Lupus Erythematosus, Raynaud s, Scleroderma Integumentary (Skin) Denies history of History of Burn, History of  pressure wounds Musculoskeletal Denies history of Gout, Rheumatoid Arthritis, Osteoarthritis, Osteomyelitis Neurologic Patient has history of Neuropathy Denies history of Dementia, Quadriplegia, Paraplegia, Seizure Disorder Oncologic Denies history of Received Chemotherapy, Received Radiation Psychiatric Denies history of Anorexia/bulimia, Confinement Anxiety Review of Systems (ROS) Constitutional Symptoms (General Health) Denies complaints or symptoms of Fever, Chills. Respiratory The patient has no complaints or symptoms. Cardiovascular The patient has no complaints or symptoms. Psychiatric The patient has no complaints or symptoms. Objective Constitutional Well-nourished and well-hydrated in no acute distress. Vitals Time Taken: 10:45 AM, Height: 61 in, Weight: 201.3 lbs, BMI: 38, Temperature: 98.2 F, Pulse: 73 bpm, Respiratory Rate: 16 breaths/min, Blood Pressure: 132/62 mmHg. Respiratory Sheena [name], Sheena A. ([id number]) normal breathing without difficulty. Psychiatric this patient is able to make decisions and demonstrates good insight into disease process. Alert and Oriented x 3. pleasant and cooperative. General Notes: Patient's wound currently did require sharp debridement today and she tolerated this without complication. With that being said really that was just extra date and dressing material removed from the edge of the wound otherwise this appears to be doing very well. Integumentary (Hair, Skin) Wound #1 status is Open. Original cause of wound was Surgical Injury. The wound is located on the Left,Midline,Anterior Lower Leg. The wound measures 0.6cm length x 0.5cm width x 0.1cm depth; 0.236cm^2 area and 0.024cm^3 volume. There is Fat Layer (Subcutaneous Tissue) Exposed exposed. There is no tunneling or undermining noted. There is a small amount of serous drainage noted. The wound margin is thickened. There is no granulation within the wound bed. There is a large  (67- 100%) amount of necrotic tissue within the wound bed including Adherent Slough. The periwound skin appearance had no abnormalities noted for texture. The periwound skin appearance had no abnormalities noted for moisture. The periwound skin appearance did not exhibit: Atrophie Blanche, Cyanosis, Ecchymosis, Hemosiderin Staining, Mottled, Pallor, Rubor, Erythema. Periwound temperature was noted as No Abnormality. The periwound has tenderness on palpation. Assessment Active Problems ICD-10 Type 2 diabetes mellitus with other skin ulcer Non-pressure chronic ulcer of other part of left lower leg with fat layer exposed Essential (primary) hypertension Chronic kidney disease, stage 3 (moderate) Procedures Wound #1 Pre-procedure diagnosis of Wound #1 is a Diabetic Wound/Ulcer of the Lower Extremity located on the Left,Midline,Anterior Lower Leg .Severity of Tissue Pre Debridement is: Fat layer exposed. There was a Selective/Open Wound Skin/Epidermis Debridement with a total area of 0.3 sq cm performed by STONE III, HOYT E., PA-C. With the following instrument(s): Curette to remove Non-Viable tissue/material. Material removed includes Skin: Epidermis, Fibrin/Exudate, and Other: adhered dressing after achieving [name] control using Lidocaine 4% Topical Solution. No specimens were taken. A time out was conducted at 11:34, prior to the start of the procedure. There was no bleeding. The  procedure was tolerated well with a [name] level of 0 throughout and a [name] level of 0 following the procedure. Post Debridement Measurements: 0.6cm length x 0.5cm width x 0.1cm depth; 0.024cm^3 volume. Character of Wound/Ulcer Post Debridement is improved. Severity of Tissue Post Debridement is: Fat layer exposed. Post procedure Diagnosis Wound #1: Same as Pre-Procedure Coole, Alinda A. ([id number]) Plan Wound Cleansing: Wound #1 Left,Midline,Anterior Lower Leg: Clean wound with Normal Saline. May Shower, gently  pat wound dry prior to applying new dressing. Anesthetic (add to Medication List): Wound #1 Left,Midline,Anterior Lower Leg: Topical Lidocaine 4% cream applied to wound bed prior to debridement (In Clinic Only). Primary Wound Dressing: Wound #1 Left,Midline,Anterior Lower Leg: Xeroform Secondary Dressing: Wound #1 Left,Midline,Anterior Lower Leg: Boardered Foam Dressing Dressing Change Frequency: Wound #1 Left,Midline,Anterior Lower Leg: Change dressing every other day. Follow-up Appointments: Wound #1 Left,Midline,Anterior Lower Leg: Return Appointment in 1 week. Edema Control: Wound #1 Left,Midline,Anterior Lower Leg: Patient to wear own compression stockings My suggestion at this point is going to be that we go ahead and continue with the above wound care measures for one more week. She did not want to continue with the compression wrap so she is gonna get a compression stocking over the counter we will use Xeroform and a Boarder Foam Dressing otherwise she does have supply she needs to get through until we see her next week. Subsequently we're gonna see were things stand at follow-up. If anything changes or worsens meantime she will contact the office and let me know. Please see above for specific wound care orders. We will see patient for re-evaluation in 1 week(s) here in the clinic. If anything worsens or changes patient will contact our office for additional recommendations. Electronic Signature(s) Signed: 12/05/2018 5:55:30 PM By: Worthy Keeler PA-C Entered By: Worthy Keeler on 12/05/2018 13:24:16 Sheena [name] ([id number]) -------------------------------------------------------------------------------- ROS/PFSH Details Patient Name: Sheena Robert A. Date of Service: 12/05/2018 10:30 AM Medical Record Number: [id number] Patient Account Number: [id number] Date of Birth/Sex: 03/16/1941 (78 y.o. F) Treating RN: Montey Hora Primary Care Provider: Glendon Axe  Other Clinician: Referring Provider: Glendon Axe Treating Provider/Extender: STONE III, HOYT Weeks in Treatment: 3 Information Obtained From Patient Wound History Do you currently have one or more open woundso Yes How many open wounds do you currently haveo 1 Approximately how long have you had your woundso october How have you been treating your wound(s) until nowo Mupirocin Has your wound(s) ever healed and then re-openedo No Have you had any lab work done in the past montho No Have you tested positive for an antibiotic resistant organism (MRSA, VRE)o No Have you tested positive for osteomyelitis (bone infection)o No Have you had any tests for circulation on your legso No Constitutional Symptoms (General Health) Complaints and Symptoms: Negative for: Fever; Chills Eyes Medical History: Positive for: Cataracts - Both removed Negative for: Glaucoma Ear/Nose/Mouth/Throat Medical History: Negative for: Chronic sinus problems/congestion; Middle ear problems Hematologic/Lymphatic Medical History: Positive for: Anemia Negative for: Hemophilia; Human Immunodeficiency Virus; Lymphedema; Sickle Cell Disease Respiratory Complaints and Symptoms: No Complaints or Symptoms Medical History: Negative for: Aspiration; Asthma; Chronic Obstructive Pulmonary Disease (COPD); Pneumothorax; Sleep Apnea; Tuberculosis Cardiovascular Complaints and Symptoms: No Complaints or Symptoms [place], Darnell A. ([id number]) Medical History: Positive for: Hypertension - Medication controlled Negative for: Angina; Arrhythmia; Congestive Heart Failure; Coronary Artery Disease; Deep Vein Thrombosis; Hypotension; Myocardial Infarction; Peripheral Arterial Disease; Peripheral Venous Disease; Phlebitis; Vasculitis Gastrointestinal Medical History: Negative for: Cirrhosis ; Colitis; Crohnos; Hepatitis A; Hepatitis B;  Hepatitis C Endocrine Medical History: Positive for: Type II Diabetes Negative for: Type  I Diabetes Time with diabetes: 20 Treated with: Oral agents Blood sugar tested every day: No Genitourinary Medical History: Positive for: End Stage Renal Disease - Stage 3 Immunological Medical History: Negative for: Lupus Erythematosus; Raynaudos; Scleroderma Integumentary (Skin) Medical History: Negative for: History of Burn; History of pressure wounds Musculoskeletal Medical History: Negative for: Gout; Rheumatoid Arthritis; Osteoarthritis; Osteomyelitis Neurologic Medical History: Positive for: Neuropathy Negative for: Dementia; Quadriplegia; Paraplegia; Seizure Disorder Oncologic Medical History: Negative for: Received Chemotherapy; Received Radiation Psychiatric Complaints and Symptoms: No Complaints or Symptoms Medical History: Negative for: Anorexia/bulimia; Confinement Anxiety Trumpower, Avayah A. ([id number]) HBO Extended History Items Eyes: Cataracts Immunizations Pneumococcal Vaccine: Received Pneumococcal Vaccination: Yes Implantable Devices Family and Social History Cancer: No; Diabetes: Yes - Paternal Grandparents; Heart Disease: No; Hereditary Spherocytosis: No; Hypertension: Yes - Father; Kidney Disease: No; Lung Disease: No; Seizures: No; Stroke: Yes - Father; Thyroid Problems: No; Tuberculosis: No; Never smoker; Marital Status - Married; Alcohol Use: Never; Drug Use: No History; Caffeine Use: Moderate; Advanced Directives: No; Patient does not want information on Advanced Directives; Do not resuscitate: No; Living Will: No; Medical Power of Attorney: No Physician Affirmation I have reviewed and agree with the above information. Electronic Signature(s) Signed: 12/05/2018 4:52:45 PM By: Montey Hora Signed: 12/05/2018 5:55:30 PM By: Worthy Keeler PA-C Entered By: Worthy Keeler on 12/05/2018 13:23:32 Sheena Robert AMarland Kitchen ([id number]) -------------------------------------------------------------------------------- SuperBill Details Patient Name: Sheena Robert A. Date of Service: 12/05/2018 Medical Record Number: [id number] Patient Account Number: [id number] Date of Birth/Sex: November 03, 1940 (78 y.o. F) Treating RN: Montey Hora Primary Care Provider: Glendon Axe Other Clinician: Referring Provider: Glendon Axe Treating Provider/Extender: Melburn Hake, HOYT Weeks in Treatment: 3 Diagnosis Coding ICD-10 Codes Code Description E11.622 Type 2 diabetes mellitus with other skin ulcer L97.822 Non-pressure chronic ulcer of other part of left lower leg with fat layer exposed I10 Essential (primary) hypertension N18.3 Chronic kidney disease, stage 3 (moderate) Facility Procedures CPT4 Code Description: [id number] - DEBRIDE WOUND 1ST 20 SQ CM OR < ICD-10 Diagnosis Description L97.822 Non-pressure chronic ulcer of other part of left lower leg with Modifier: fat layer expos Quantity: 1 ed Physician Procedures CPT4 Code Description: [id number] - WC PHYS DEBR WO ANESTH 20 SQ CM ICD-10 Diagnosis Description L97.822 Non-pressure chronic ulcer of other part of left lower leg with Modifier: fat layer expos Quantity: 1 ed Electronic Signature(s) Signed: 12/05/2018 5:55:30 PM By: Worthy Keeler PA-C Entered By: Worthy Keeler on 12/05/2018 13:24:26

## 2018-12-08 NOTE — Progress Notes (Signed)
GINETTE, BRADWAY ([id number]) Visit Report for 12/05/2018 Arrival Information Details Patient Name: Sheena [name], Sheena A. Date of Service: 12/05/2018 10:30 AM Medical Record Number: [id number] Patient Account Number: [id number] Date of Birth/Sex: 09-01-1941 (78 y.o. F) Treating RN: Montey Hora Primary Care Tyden Kann: Glendon Axe Other Clinician: Referring Krystian Ferrentino: Glendon Axe Treating Yissel Habermehl/Extender: STONE III, HOYT Weeks in Treatment: 3 Visit Information History Since Last Visit Added or deleted any medications: No Patient Arrived: Ambulatory Any new allergies or adverse reactions: No Arrival Time: 10:44 Had a fall or experienced change in No Accompanied By: self activities of daily living that may affect Transfer Assistance: None risk of falls: Patient Identification Verified: Yes Signs or symptoms of abuse/neglect since last visito No Secondary Verification Process Completed: Yes Hospitalized since last visit: No Implantable device outside of the clinic excluding No cellular tissue based products placed in the center since last visit: Has Dressing in Place as Prescribed: Yes [name] Present Now: No Electronic Signature(s) Signed: 12/05/2018 4:14:38 PM By: Lorine Bears RCP, RRT, CHT Entered By: Lorine Bears on 12/05/2018 10:45:26 Sheena [name] ([id number]) -------------------------------------------------------------------------------- Encounter Discharge Information Details Patient Name: Sheena Robert A. Date of Service: 12/05/2018 10:30 AM Medical Record Number: [id number] Patient Account Number: [id number] Date of Birth/Sex: [birth year] (78 y.o. F) Treating RN: Montey Hora Primary Care TRUE Garciamartinez: Glendon Axe Other Clinician: Referring Aida Lemaire: Glendon Axe Treating Misha Antonini/Extender: STONE III, HOYT Weeks in Treatment: 3 Encounter Discharge Information Items Post Procedure Vitals Discharge Condition: Stable Temperature  (F): 98.2 Ambulatory Status: Ambulatory Pulse (bpm): 73 Discharge Destination: Home Respiratory Rate (breaths/min): 16 Transportation: Private Auto Blood Pressure (mmHg): 132/62 Accompanied By: self Schedule Follow-up Appointment: Yes Clinical Summary of Care: Electronic Signature(s) Signed: 12/05/2018 4:52:45 PM By: Montey Hora Entered By: Montey Hora on 12/05/2018 11:39:18 Sheena [name], Sheena A. ([id number]) -------------------------------------------------------------------------------- Lower Extremity Assessment Details Patient Name: Sheena Robert A. Date of Service: 12/05/2018 10:30 AM Medical Record Number: [id number] Patient Account Number: [id number] Date of Birth/Sex: 1941/01/20 (78 y.o. F) Treating RN: Harold Barban Primary Care Areli Frary: Glendon Axe Other Clinician: Referring Orian Amberg: Glendon Axe Treating Lempi Edwin/Extender: STONE III, HOYT Weeks in Treatment: 3 Edema Assessment Assessed: [Left: No] [Right: No] [Left: Edema] [Right: :] Calf Left: Right: Point of Measurement: 29 cm From Medial Instep 37.1 cm cm Ankle Left: Right: Point of Measurement: 14 cm From Medial Instep 22.5 cm cm Vascular Assessment Pulses: Dorsalis Pedis Palpable: [Left:Yes] Posterior Tibial Palpable: [Left:Yes] Extremity colors, hair growth, and conditions: Extremity Color: [Left:Normal] Hair Growth on Extremity: [Left:Yes] Temperature of Extremity: [Left:Warm] Capillary Refill: [Left:< 3 seconds] Toe Nail Assessment Left: Right: Thick: Yes Discolored: Yes Deformed: Yes Improper Length and Hygiene: No Electronic Signature(s) Signed: 12/07/2018 11:43:02 AM By: Harold Barban Entered By: Harold Barban on 12/05/2018 11:00:33 Sheena [name], Sheena A. ([id number]) -------------------------------------------------------------------------------- Multi Wound Chart Details Patient Name: Sheena Robert A. Date of Service: 12/05/2018 10:30 AM Medical Record Number:  [id number] Patient Account Number: [id number] Date of Birth/Sex: 01-Jan-1941 (78 y.o. F) Treating RN: Montey Hora Primary Care Mena Simonis: Glendon Axe Other Clinician: Referring Romaldo Saville: Glendon Axe Treating Sheniece Ruggles/Extender: STONE III, HOYT Weeks in Treatment: 3 Vital Signs Height(in): 61 Pulse(bpm): 57 Weight(lbs): 201.3 Blood Pressure(mmHg): 132/62 Body Mass Index(BMI): 38 Temperature(F): 98.2 Respiratory Rate 16 (breaths/min): Photos: [1:No Photos] [N/A:N/A] Wound Location: [1:Left Lower Leg - Midline, Anterior] [N/A:N/A] Wounding Event: [1:Surgical Injury] [N/A:N/A] Primary Etiology: [1:Diabetic Wound/Ulcer of the Lower Extremity] [N/A:N/A] Comorbid History: [1:Cataracts, Anemia, Hypertension, Type II Diabetes, End Stage Renal Disease, Neuropathy] [N/A:N/A] Date Acquired: [1:08/01/2018] [N/A:N/A] Weeks of Treatment: [1:3] [N/A:N/A] Wound  Status: [1:Open] [N/A:N/A] Measurements L x W x D [1:0.6x0.5x0.1] [N/A:N/A] (cm) Area (cm) : [1:0.236] [N/A:N/A] Volume (cm) : [1:0.024] [N/A:N/A] % Reduction in Area: [1:85.70%] [N/A:N/A] % Reduction in Volume: [1:95.20%] [N/A:N/A] Classification: [1:Grade 2] [N/A:N/A] Exudate Amount: [1:Small] [N/A:N/A] Exudate Type: [1:Serous] [N/A:N/A] Exudate Color: [1:amber] [N/A:N/A] Wound Margin: [1:Thickened] [N/A:N/A] Granulation Amount: [1:None Present (0%)] [N/A:N/A] Necrotic Amount: [1:Large (67-100%)] [N/A:N/A] Exposed Structures: [1:Fat Layer (Subcutaneous Tissue) Exposed: Yes Fascia: No Tendon: No Muscle: No Joint: No Bone: No] [N/A:N/A] Epithelialization: [1:None] [N/A:N/A] Periwound Skin Texture: [1:Excoriation: No Induration: No Callus: No] [N/A:N/A] Crepitus: No Rash: No Scarring: No Periwound Skin Moisture: Maceration: No N/A N/A Dry/Scaly: No Periwound Skin Color: Atrophie Blanche: No N/A N/A Cyanosis: No Ecchymosis: No Erythema: No Hemosiderin Staining: No Mottled: No Pallor: No Rubor: No Temperature:  No Abnormality N/A N/A Tenderness on Palpation: Yes N/A N/A Wound Preparation: Ulcer Cleansing: N/A N/A Rinsed/Irrigated with Saline, Other: soap and water Topical Anesthetic Applied: Other: lidocaine 4% Treatment Notes Electronic Signature(s) Signed: 12/05/2018 4:52:45 PM By: Montey Hora Entered By: Montey Hora on 12/05/2018 11:35:09 Sheena [name] ([id number]) -------------------------------------------------------------------------------- Multi-Disciplinary Care Plan Details Patient Name: Sheena Robert A. Date of Service: 12/05/2018 10:30 AM Medical Record Number: [id number] Patient Account Number: [id number] Date of Birth/Sex: 1941/04/14 (78 y.o. F) Treating RN: Montey Hora Primary Care Germaine Shenker: Glendon Axe Other Clinician: Referring Ngina Royer: Glendon Axe Treating Walburga Hudman/Extender: STONE III, HOYT Weeks in Treatment: 3 Active Inactive Nutrition Nursing Diagnoses: Imbalanced nutrition Goals: Patient/caregiver agrees to and verbalizes understanding of need to use nutritional supplements and/or vitamins as prescribed Date Initiated: 11/14/2018 Target Resolution Date: 02/03/2019 Goal Status: Active Interventions: Assess patient nutrition upon admission and as needed per policy Notes: Orientation to the Wound Care Program Nursing Diagnoses: Knowledge deficit related to the wound healing center program Goals: Patient/caregiver will verbalize understanding of the [facility] Date Initiated: 11/14/2018 Target Resolution Date: 02/03/2019 Goal Status: Active Interventions: Provide education on orientation to the wound center Notes: [name], Acute or Chronic Nursing Diagnoses: [name], acute or chronic: actual or potential Goals: Patient will verbalize adequate [name] control and receive [name] control interventions during procedures as needed Date Initiated: 11/14/2018 Target Resolution Date: 02/03/2019 Goal Status: Active Interventions: Complete  [name] assessment as per visit requirements Sheena [name], Sheena A. ([id number]) Notes: Wound/Skin Impairment Nursing Diagnoses: Impaired tissue integrity Goals: Ulcer/skin breakdown will heal within 14 weeks Date Initiated: 11/14/2018 Target Resolution Date: 02/03/2019 Goal Status: Active Interventions: Assess patient/caregiver ability to obtain necessary supplies Assess patient/caregiver ability to perform ulcer/skin care regimen upon admission and as needed Assess ulceration(s) every visit Notes: Electronic Signature(s) Signed: 12/05/2018 4:52:45 PM By: Montey Hora Entered By: Montey Hora on 12/05/2018 11:35:03 Sheena [name], Sheena A. ([id number]) -------------------------------------------------------------------------------- [name] Assessment Details Patient Name: Sheena Robert A. Date of Service: 12/05/2018 10:30 AM Medical Record Number: [id number] Patient Account Number: [id number] Date of Birth/Sex: 03-02-1941 (78 y.o. F) Treating RN: Montey Hora Primary Care Zephan Beauchaine: Glendon Axe Other Clinician: Referring Anallely Rosell: Glendon Axe Treating Sheana Bir/Extender: STONE III, HOYT Weeks in Treatment: 3 Active Problems Location of [name] Severity and Description of [name] Patient Has Paino No Site Locations [name] Management and Medication Current [name] Management: Electronic Signature(s) Signed: 12/05/2018 4:14:38 PM By: Paulla Fore, RRT, CHT Signed: 12/05/2018 4:52:45 PM By: Montey Hora Entered By: Lorine Bears on 12/05/2018 10:45:33 Sheena [name] ([id number]) -------------------------------------------------------------------------------- Patient/Caregiver Education Details Patient Name: Sheena Robert A. Date of Service: 12/05/2018 10:30 AM Medical Record Number: [id number] Patient Account Number: [id number] Date of Birth/Gender: February 18, 1941 (78 y.o. F)  Treating RN: Montey Hora Primary Care Physician: Glendon Axe Other  Clinician: Referring Physician: Glendon Axe Treating Physician/Extender: Sharalyn Ink in Treatment: 3 Education Assessment Education Provided To: Patient Education Topics Provided Wound/Skin Impairment: Handouts: Other: wound care as ordered Methods: Demonstration, Explain/Verbal Responses: State content correctly Electronic Signature(s) Signed: 12/05/2018 4:52:45 PM By: Montey Hora Entered By: Montey Hora on 12/05/2018 11:39:38 Sheena [name], Sheena A. ([id number]) -------------------------------------------------------------------------------- Wound Assessment Details Patient Name: Sheena Robert A. Date of Service: 12/05/2018 10:30 AM Medical Record Number: [id number] Patient Account Number: [id number] Date of Birth/Sex: [birth year] (78 y.o. F) Treating RN: Harold Barban Primary Care Rayven Rettig: Glendon Axe Other Clinician: Referring Aalaysia Liggins: Glendon Axe Treating Jeremey Bascom/Extender: STONE III, HOYT Weeks in Treatment: 3 Wound Status Wound Number: 1 Primary Diabetic Wound/Ulcer of the Lower Extremity Etiology: Wound Location: Left Lower Leg - Midline, Anterior Wound Open Wounding Event: Surgical Injury Status: Date Acquired: 08/01/2018 Comorbid Cataracts, Anemia, Hypertension, Type II Weeks Of Treatment: 3 History: Diabetes, End Stage Renal Disease, Clustered Wound: No Neuropathy Photos Photo Uploaded By: Harold Barban on 12/05/2018 12:24:07 Wound Measurements Length: (cm) 0.6 Width: (cm) 0.5 Depth: (cm) 0.1 Area: (cm) 0.236 Volume: (cm) 0.024 % Reduction in Area: 85.7% % Reduction in Volume: 95.2% Epithelialization: None Tunneling: No Undermining: No Wound Description Classification: Grade 2 Wound Margin: Thickened Exudate Amount: Small Exudate Type: Serous Exudate Color: amber Foul Odor After Cleansing: No Slough/Fibrino Yes Wound Bed Granulation Amount: None Present (0%) Exposed Structure Necrotic Amount: Large (67-100%) Fascia  Exposed: No Necrotic Quality: Adherent Slough Fat Layer (Subcutaneous Tissue) Exposed: Yes Tendon Exposed: No Muscle Exposed: No Joint Exposed: No Bone Exposed: No Periwound Skin Texture Sheena [name], Sheena A. ([id number]) Texture Color No Abnormalities Noted: Yes No Abnormalities Noted: No Atrophie Blanche: No Moisture Cyanosis: No No Abnormalities Noted: Yes Ecchymosis: No Erythema: No Hemosiderin Staining: No Mottled: No Pallor: No Rubor: No Temperature / [name] Temperature: No Abnormality Tenderness on Palpation: Yes Wound Preparation Ulcer Cleansing: Rinsed/Irrigated with Saline, Other: soap and water, Topical Anesthetic Applied: Other: lidocaine 4%, Treatment Notes Wound #1 (Left, Midline, Anterior Lower Leg) Notes xeroform and bordered foam dressing Electronic Signature(s) Signed: 12/07/2018 11:43:02 AM By: Harold Barban Entered By: Harold Barban on 12/05/2018 10:59:40 Sheena [name], Sheena A. ([id number]) -------------------------------------------------------------------------------- Vitals Details Patient Name: Sheena Robert A. Date of Service: 12/05/2018 10:30 AM Medical Record Number: [id number] Patient Account Number: [id number] Date of Birth/Sex: 1941-08-14 (78 y.o. F) Treating RN: Montey Hora Primary Care Merik Mignano: Glendon Axe Other Clinician: Referring Mai Longnecker: Glendon Axe Treating Christine Morton/Extender: STONE III, HOYT Weeks in Treatment: 3 Vital Signs Time Taken: 10:45 Temperature (F): 98.2 Height (in): 61 Pulse (bpm): 73 Weight (lbs): 201.3 Respiratory Rate (breaths/min): 16 Body Mass Index (BMI): 38 Blood Pressure (mmHg): 132/62 Reference Range: 80 - 120 mg / dl Airway Electronic Signature(s) Signed: 12/05/2018 4:14:38 PM By: Lorine Bears RCP, RRT, CHT Entered By: Lorine Bears on 12/05/2018 10:49:59

## 2018-12-12 ENCOUNTER — Encounter: Payer: PPO | Admitting: Physician Assistant

## 2018-12-12 DIAGNOSIS — E11622 Type 2 diabetes mellitus with other skin ulcer: Secondary | ICD-10-CM | POA: Diagnosis not present

## 2018-12-12 DIAGNOSIS — T8189XA Other complications of procedures, not elsewhere classified, initial encounter: Secondary | ICD-10-CM | POA: Diagnosis not present

## 2018-12-12 DIAGNOSIS — L97822 Non-pressure chronic ulcer of other part of left lower leg with fat layer exposed: Secondary | ICD-10-CM | POA: Diagnosis not present

## 2018-12-15 DIAGNOSIS — E1122 Type 2 diabetes mellitus with diabetic chronic kidney disease: Secondary | ICD-10-CM | POA: Diagnosis not present

## 2018-12-15 DIAGNOSIS — N183 Chronic kidney disease, stage 3 (moderate): Secondary | ICD-10-CM | POA: Diagnosis not present

## 2018-12-15 DIAGNOSIS — D649 Anemia, unspecified: Secondary | ICD-10-CM | POA: Diagnosis not present

## 2018-12-17 NOTE — Progress Notes (Signed)
Sheena, [name] ([id number]) Visit Report for 12/12/2018 Chief Complaint Document Details Patient Name: Sheena [name], Sheena A. Date of Service: 12/12/2018 10:30 AM Medical Record Number: [id number] Patient Account Number: [id number] Date of Birth/Sex: 1941/10/03 (78 y.o. F) Treating RN: Montey Hora Primary Care Provider: Glendon Axe Other Clinician: Referring Provider: Glendon Axe Treating Provider/Extender: Melburn Hake, Vihaan Gloss Weeks in Treatment: 4 Information Obtained from: Patient Chief Complaint Left leg ulcer Electronic Signature(s) Signed: 12/17/2018 7:15:48 AM By: Worthy Keeler PA-C Entered By: Worthy Keeler on 12/12/2018 10:59:33 Lope, Geselle A. ([id number]) -------------------------------------------------------------------------------- HPI Details Patient Name: Sheena Robert A. Date of Service: 12/12/2018 10:30 AM Medical Record Number: [id number] Patient Account Number: [id number] Date of Birth/Sex: December 11, 1940 (78 y.o. F) Treating RN: Montey Hora Primary Care Provider: Glendon Axe Other Clinician: Referring Provider: Glendon Axe Treating Provider/Extender: Melburn Hake, Sharece Fleischhacker Weeks in Treatment: 4 History of Present Illness HPI Description: 11/14/18 on evaluation today patient presents for initial evaluation our clinic concerning issues that she is having with her left lower extremity secondary to a biopsy which was performed on August 01, 2018. This area come back as an actinic keratosis and was not cancerous. With that being said she's had a very difficult time getting this we can heal although she states and last month it has been somewhat better than previous. Fortunately there does not appear to be any signs of infection. She did have recommendations from the dermatologist to do vinegar soaks twice a day along with applying Bactroban following. She is a diabetic type II with her most recent reported hemoglobin A1c of 6.7 this is from the patient  we did not have record of this. She also has hypertension and chronic kidney disease stage III along with hypothyroidism. No fevers, chills, nausea, or vomiting noted at this time. 11/21/18 on evaluation today patient does appear to be showing signs of improvement which is good news. She has been tolerating the dressing changes without complication. Fortunately there does not appear to be any sign of infection which is also great news. With that being said she does have some swelling of the lower extremity. This is a little bit more than what she's used to having for the most part. She is having no pain in hopes that I do not have to do a lot in the way of debridement today. 11/28/18 on evaluation today patient appears to be doing excellent in regard to her wound. In fact it doesn't even appear that we're gonna require any debridement today which is excellent news. She has signed so good improvement and no evidence of infection. No fevers, chills, nausea, or vomiting noted at this time. 12/05/18 on evaluation today patient appears to be doing better in regard to the lower extremity ulcer. She has been tolerating the dressing changes without complication. Fortunately there's no signs of infection. With that being said this is very close to complete closure at this point. 12/12/18 on evaluation today patient actually appears to be doing excellent at this time. The wound in fact appears to be completely closed which is great news. Overall she's having no pain and has done extremely well. Electronic Signature(s) Signed: 12/17/2018 7:15:48 AM By: Worthy Keeler PA-C Entered By: Worthy Keeler on 12/12/2018 12:00:51 Jerline Pain ([id number]) -------------------------------------------------------------------------------- Physical Exam Details Patient Name: Sheena Robert A. Date of Service: 12/12/2018 10:30 AM Medical Record Number: [id number] Patient Account Number: [id number] Date of  Birth/Sex: 07/02/1941 (78 y.o. F) Treating RN: Montey Hora Primary Care Provider: Glendon Axe Other  Clinician: Referring Provider: Glendon Axe Treating Provider/Extender: STONE III, Lanelle Lindo Weeks in Treatment: 4 Constitutional Well-nourished and well-hydrated in no acute distress. Respiratory normal breathing without difficulty. clear to auscultation bilaterally. Cardiovascular regular rate and rhythm with normal S1, S2. 1+ pitting edema of the bilateral lower extremities. Psychiatric this patient is able to make decisions and demonstrates good insight into disease process. Alert and Oriented x 3. pleasant and cooperative. Notes Patient's wound bed currently shows signs of complete epithelialization there's no pain and overall I think the the wound does appear to be close as a little bit of a discoloration over the region although it doesn't appear to be nearly anything that would indicate an opening. With regard to the swelling she has been wearing her compression stockings that she got she does feel like that this is helping she states actually makes her legs feel better. Electronic Signature(s) Signed: 12/17/2018 7:15:48 AM By: Worthy Keeler PA-C Entered By: Worthy Keeler on 12/12/2018 12:01:25 Jerline Pain ([id number]) -------------------------------------------------------------------------------- Physician Orders Details Patient Name: Sheena Robert A. Date of Service: 12/12/2018 10:30 AM Medical Record Number: [id number] Patient Account Number: [id number] Date of Birth/Sex: 10/31/1941 (78 y.o. F) Treating RN: Montey Hora Primary Care Provider: Glendon Axe Other Clinician: Referring Provider: Glendon Axe Treating Provider/Extender: Melburn Hake, Octavio Matheney Weeks in Treatment: 4 Verbal / Phone Orders: No Diagnosis Coding ICD-10 Coding Code Description E11.622 Type 2 diabetes mellitus with other skin ulcer L97.822 Non-pressure chronic ulcer of other part of  left lower leg with fat layer exposed I10 Essential (primary) hypertension N18.3 Chronic kidney disease, stage 3 (moderate) Discharge From [facility] Services o Discharge from [place] Signature(s) Signed: 12/12/2018 4:52:44 PM By: Montey Hora Signed: 12/17/2018 7:15:48 AM By: Worthy Keeler PA-C Entered By: Montey Hora on 12/12/2018 11:02:40 Mcclatchey, Arilynn A. ([id number]) -------------------------------------------------------------------------------- Problem List Details Patient Name: Sheena Robert A. Date of Service: 12/12/2018 10:30 AM Medical Record Number: [id number] Patient Account Number: [id number] Date of Birth/Sex: 1941/03/14 (78 y.o. F) Treating RN: Montey Hora Primary Care Provider: Glendon Axe Other Clinician: Referring Provider: Glendon Axe Treating Provider/Extender: Melburn Hake, Mechell Girgis Weeks in Treatment: 4 Active Problems ICD-10 Evaluated Encounter Code Description Active Date Today Diagnosis E11.622 Type 2 diabetes mellitus with other skin ulcer 11/14/2018 No Yes L97.822 Non-pressure chronic ulcer of other part of left lower leg with 11/14/2018 No Yes fat layer exposed I10 Essential (primary) hypertension 11/14/2018 No Yes N18.3 Chronic kidney disease, stage 3 (moderate) 11/14/2018 No Yes Inactive Problems Resolved Problems Electronic Signature(s) Signed: 12/17/2018 7:15:48 AM By: Worthy Keeler PA-C Entered By: Worthy Keeler on 12/12/2018 10:59:29 Brodt, Burma A. ([id number]) -------------------------------------------------------------------------------- Progress Note Details Patient Name: Sheena Robert A. Date of Service: 12/12/2018 10:30 AM Medical Record Number: [id number] Patient Account Number: [id number] Date of Birth/Sex: [birth year] (78 y.o. F) Treating RN: Montey Hora Primary Care Provider: Glendon Axe Other Clinician: Referring Provider: Glendon Axe Treating Provider/Extender: Melburn Hake, Sircharles Holzheimer Weeks in  Treatment: 4 Subjective Chief Complaint Information obtained from Patient Left leg ulcer History of Present Illness (HPI) 11/14/18 on evaluation today patient presents for initial evaluation our clinic concerning issues that she is having with her left lower extremity secondary to a biopsy which was performed on August 01, 2018. This area come back as an actinic keratosis and was not cancerous. With that being said she's had a very difficult time getting this we can heal although she states and last month it has been somewhat better than previous. Fortunately there does not appear  to be any signs of infection. She did have recommendations from the dermatologist to do vinegar soaks twice a day along with applying Bactroban following. She is a diabetic type II with her most recent reported hemoglobin A1c of 6.7 this is from the patient we did not have record of this. She also has hypertension and chronic kidney disease stage III along with hypothyroidism. No fevers, chills, nausea, or vomiting noted at this time. 11/21/18 on evaluation today patient does appear to be showing signs of improvement which is good news. She has been tolerating the dressing changes without complication. Fortunately there does not appear to be any sign of infection which is also great news. With that being said she does have some swelling of the lower extremity. This is a little bit more than what she's used to having for the most part. She is having no pain in hopes that I do not have to do a lot in the way of debridement today. 11/28/18 on evaluation today patient appears to be doing excellent in regard to her wound. In fact it doesn't even appear that we're gonna require any debridement today which is excellent news. She has signed so good improvement and no evidence of infection. No fevers, chills, nausea, or vomiting noted at this time. 12/05/18 on evaluation today patient appears to be doing better in regard to the  lower extremity ulcer. She has been tolerating the dressing changes without complication. Fortunately there's no signs of infection. With that being said this is very close to complete closure at this point. 12/12/18 on evaluation today patient actually appears to be doing excellent at this time. The wound in fact appears to be completely closed which is great news. Overall she's having no pain and has done extremely well. Patient History Information obtained from Patient. Family History Diabetes - Paternal Grandparents, Hypertension - Father, Stroke - Father, No family history of Cancer, Heart Disease, Hereditary Spherocytosis, Kidney Disease, Lung Disease, Seizures, Thyroid Problems, Tuberculosis. Social History Never smoker, Marital Status - Married, Alcohol Use - Never, Drug Use - No History, Caffeine Use - Moderate. Medical History Eyes Patient has history of Cataracts - Both removed Oshima, Alaia A. ([id number]) Denies history of Glaucoma Ear/Nose/Mouth/Throat Denies history of Chronic sinus problems/congestion, Middle ear problems Hematologic/Lymphatic Patient has history of Anemia Denies history of Hemophilia, Human Immunodeficiency Virus, Lymphedema, Sickle Cell Disease Respiratory Denies history of Aspiration, Asthma, Chronic Obstructive Pulmonary Disease (COPD), Pneumothorax, Sleep Apnea, Tuberculosis Cardiovascular Patient has history of Hypertension - Medication controlled Denies history of Angina, Arrhythmia, Congestive Heart Failure, Coronary Artery Disease, Deep Vein Thrombosis, Hypotension, Myocardial Infarction, Peripheral Arterial Disease, Peripheral Venous Disease, Phlebitis, Vasculitis Gastrointestinal Denies history of Cirrhosis , Colitis, Crohn s, Hepatitis A, Hepatitis B, Hepatitis C Endocrine Patient has history of Type II Diabetes Denies history of Type I Diabetes Genitourinary 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 Immunological Denies history of Lupus Erythematosus, Raynaud s, Scleroderma Integumentary (Skin) Denies history of History of Burn, History of pressure wounds Musculoskeletal Denies history of Gout, Rheumatoid Arthritis, Osteoarthritis, Osteomyelitis Neurologic Patient has history of Neuropathy Denies history of Dementia, Quadriplegia, Paraplegia, Seizure Disorder Oncologic Denies history of Received Chemotherapy, Received Radiation Psychiatric Denies history of Anorexia/bulimia, Confinement Anxiety Review of Systems (ROS) Constitutional Symptoms (General Health) Denies complaints or symptoms of Fever, Chills. Respiratory The patient has no complaints or symptoms. Cardiovascular Complains or has symptoms of LE edema. Psychiatric The patient has no complaints or symptoms. Objective Constitutional Well-nourished and  well-hydrated in no acute distress. Vitals Time Taken: 10:40 AM, Height: 61 in, Weight: 201.3 lbs, BMI: 38, Temperature: 98.1 F, Pulse: 73 bpm, Respiratory Rate: 16 breaths/min, Blood Pressure: 129/52 mmHg. Groesbeck, Shawnese A. ([id number]) Respiratory normal breathing without difficulty. clear to auscultation bilaterally. Cardiovascular regular rate and rhythm with normal S1, S2. 1+ pitting edema of the bilateral lower extremities. Psychiatric this patient is able to make decisions and demonstrates good insight into disease process. Alert and Oriented x 3. pleasant and cooperative. General Notes: Patient's wound bed currently shows signs of complete epithelialization there's no pain and overall I think the the wound does appear to be close as a little bit of a discoloration over the region although it doesn't appear to be nearly anything that would indicate an opening. With regard to the swelling she has been wearing her compression stockings that she got she does feel like that this is helping she states actually makes her legs feel better. Integumentary (Hair,  Skin) Wound #1 status is Healed - Epithelialized. Original cause of wound was Surgical Injury. The wound is located on the Left,Midline,Anterior Lower Leg. The wound measures 0cm length x 0cm width x 0cm depth; 0cm^2 area and 0cm^3 volume. There is Fat Layer (Subcutaneous Tissue) Exposed exposed. There is no tunneling or undermining noted. There is a small amount of serous drainage noted. The wound margin is thickened. There is no granulation within the wound bed. There is a large (67-100%) amount of necrotic tissue within the wound bed. The periwound skin appearance had no abnormalities noted for texture. The periwound skin appearance had no abnormalities noted for moisture. The periwound skin appearance did not exhibit: Atrophie Blanche, Cyanosis, Ecchymosis, Hemosiderin Staining, Mottled, Pallor, Rubor, Erythema. Periwound temperature was noted as No Abnormality. The periwound has tenderness on palpation. Assessment Active Problems ICD-10 Type 2 diabetes mellitus with other skin ulcer Non-pressure chronic ulcer of other part of left lower leg with fat layer exposed Essential (primary) hypertension Chronic kidney disease, stage 3 (moderate) Plan Discharge From [facility] Services: Discharge from [place] My suggestion at this point is gonna be that we go ahead and continue with having her where the compression stockings and discontinue wound care services since the wound appears to be completely healed. She's in agreement with this plan and states that she actually likes the stockings and will be wearing those she didn't wear them today just because of the weather [place], [place]. ([id number]) and how rainy it is she states it's hard enough to get the morning off when they're dried much less when you're wet. We will subsequently see her back just as needed in the future if anything changes or worsens. Electronic Signature(s) Signed: 12/17/2018 7:15:48 AM By: Worthy Keeler  PA-C Entered By: Worthy Keeler on 12/12/2018 12:01:58 Jerline Pain ([id number]) -------------------------------------------------------------------------------- ROS/PFSH Details Patient Name: Sheena Robert A. Date of Service: 12/12/2018 10:30 AM Medical Record Number: [id number] Patient Account Number: [id number] Date of Birth/Sex: 03-03-1941 (78 y.o. F) Treating RN: Montey Hora Primary Care Provider: Glendon Axe Other Clinician: Referring Provider: Glendon Axe Treating Provider/Extender: STONE III, Jorge Amparo Weeks in Treatment: 4 Information Obtained From Patient Wound History Do you currently have one or more open woundso Yes How many open wounds do you currently haveo 1 Approximately how long have you had your woundso october How have you been treating your wound(s) until nowo Mupirocin Has your wound(s) ever healed and then re-openedo No Have you had any lab work done in the past  montho No Have you tested positive for an antibiotic resistant organism (MRSA, VRE)o No Have you tested positive for osteomyelitis (bone infection)o No Have you had any tests for circulation on your legso No Constitutional Symptoms (General Health) Complaints and Symptoms: Negative for: Fever; Chills Cardiovascular Complaints and Symptoms: Positive for: LE edema Medical History: Positive for: Hypertension - Medication controlled Negative for: Angina; Arrhythmia; Congestive Heart Failure; Coronary Artery Disease; Deep Vein Thrombosis; Hypotension; Myocardial Infarction; Peripheral Arterial Disease; Peripheral Venous Disease; Phlebitis; Vasculitis Eyes Medical History: Positive for: Cataracts - Both removed Negative for: Glaucoma Ear/Nose/Mouth/Throat Medical History: Negative for: Chronic sinus problems/congestion; Middle ear problems Hematologic/Lymphatic Medical History: Positive for: Anemia Negative for: Hemophilia; Human Immunodeficiency Virus; Lymphedema; Sickle Cell  Disease Respiratory Complaints and Symptoms: No Complaints or Symptoms Shouse, Wylodean A. ([id number]) Medical History: Negative for: Aspiration; Asthma; Chronic Obstructive Pulmonary Disease (COPD); Pneumothorax; Sleep Apnea; Tuberculosis Gastrointestinal Medical History: Negative for: Cirrhosis ; Colitis; Crohnos; Hepatitis A; Hepatitis B; Hepatitis C Endocrine Medical History: Positive for: Type II Diabetes Negative for: Type I Diabetes Time with diabetes: 20 Treated with: Oral agents Blood sugar tested every day: No Genitourinary Medical History: Positive for: End Stage Renal Disease - Stage 3 Immunological Medical History: Negative for: Lupus Erythematosus; Raynaudos; Scleroderma Integumentary (Skin) Medical History: Negative for: History of Burn; History of pressure wounds Musculoskeletal Medical History: Negative for: Gout; Rheumatoid Arthritis; Osteoarthritis; Osteomyelitis Neurologic Medical History: Positive for: Neuropathy Negative for: Dementia; Quadriplegia; Paraplegia; Seizure Disorder Oncologic Medical History: Negative for: Received Chemotherapy; Received Radiation Psychiatric Complaints and Symptoms: No Complaints or Symptoms Medical History: Negative for: Anorexia/bulimia; Confinement Anxiety Gambrill, Devaney A. ([id number]) HBO Extended History Items Eyes: Cataracts Immunizations Pneumococcal Vaccine: Received Pneumococcal Vaccination: Yes Implantable Devices Family and Social History Cancer: No; Diabetes: Yes - Paternal Grandparents; Heart Disease: No; Hereditary Spherocytosis: No; Hypertension: Yes - Father; Kidney Disease: No; Lung Disease: No; Seizures: No; Stroke: Yes - Father; Thyroid Problems: No; Tuberculosis: No; Never smoker; Marital Status - Married; Alcohol Use: Never; Drug Use: No History; Caffeine Use: Moderate; Advanced Directives: No; Patient does not want information on Advanced Directives; Do not resuscitate: No; Living Will:  No; Medical Power of Attorney: No Physician Affirmation I have reviewed and agree with the above information. Electronic Signature(s) Signed: 12/12/2018 4:52:44 PM By: Montey Hora Signed: 12/17/2018 7:15:48 AM By: Worthy Keeler PA-C Entered By: Worthy Keeler on 12/12/2018 12:01:06 Sheena Robert A. ([id number]) -------------------------------------------------------------------------------- SuperBill Details Patient Name: Sheena Robert A. Date of Service: 12/12/2018 Medical Record Number: [id number] Patient Account Number: [id number] Date of Birth/Sex: [birth year] (78 y.o. F) Treating RN: Montey Hora Primary Care Provider: Glendon Axe Other Clinician: Referring Provider: Glendon Axe Treating Provider/Extender: Melburn Hake, Vernis Eid Weeks in Treatment: 4 Diagnosis Coding ICD-10 Codes Code Description E11.622 Type 2 diabetes mellitus with other skin ulcer L97.822 Non-pressure chronic ulcer of other part of left lower leg with fat layer exposed I10 Essential (primary) hypertension N18.3 Chronic kidney disease, stage 3 (moderate) Facility Procedures CPT4 Code: [id number] Description: 99213 - WOUND CARE VISIT-LEV 3 EST PT Modifier: Quantity: 1 Physician Procedures CPT4 Code Description: [id number] 99213 - WC PHYS LEVEL 3 - EST PT ICD-10 Diagnosis Description E11.622 Type 2 diabetes mellitus with other skin ulcer L97.822 Non-pressure chronic ulcer of other part of left lower leg wit I10 Essential (primary)  hypertension N18.3 Chronic kidney disease, stage 3 (moderate) Modifier: h fat layer expos Quantity: 1 ed Electronic Signature(s) Signed: 12/17/2018 7:15:48 AM By: Worthy Keeler PA-C Entered By: Worthy Keeler on 12/12/2018 12:02:11

## 2018-12-17 NOTE — Progress Notes (Signed)
KEYERA, [place] ([id number]) Visit Report for 12/12/2018 Arrival Information Details Patient Name: Sheena [name], Sheena [name]. Date of Service: 12/12/2018 10:30 AM Medical Record Number: [id number] Patient Account Number: [id number] Date of Birth/Sex: 1940/11/26 (78 y.o. F) Treating RN: Sheena [name] Primary Care Sheena [name]: Sheena [name] Other Clinician: Referring Sheena [name]: Sheena [name] Treating Dameisha Tschida/Extender: Sheena [name], Sheena [name] Weeks in Treatment: 4 Visit Information History Since Last Visit Added or deleted any medications: No Patient Arrived: Ambulatory Any new allergies or adverse reactions: No Arrival Time: 10:39 Had [name] fall or experienced change in No Accompanied By: self activities of daily living that may affect Transfer Assistance: None risk of falls: Patient Identification Verified: Yes Signs or symptoms of abuse/neglect since last visito No Secondary Verification Process Completed: Yes Hospitalized since last visit: No Implantable device outside of the clinic excluding No cellular tissue based products placed in the center since last visit: Has Dressing in Place as Prescribed: Yes [name] Present Now: No Electronic Signature(s) Signed: 12/12/2018 3:52:51 PM By: Sheena [name] RCP, RRT, CHT Entered By: Sheena [name] on 12/12/2018 10:40:30 Sheena [name], Sheena [name]. ([id number]) -------------------------------------------------------------------------------- Clinic Level of Care Assessment Details Patient Name: Sheena [name], Sheena [name]. Date of Service: 12/12/2018 10:30 AM Medical Record Number: [id number] Patient Account Number: [id number] Date of Birth/Sex: [birth year] (78 y.o. F) Treating RN: Sheena [name] Primary Care Sheena [name]: Sheena [name] Other Clinician: Referring Sheena [name]: Sheena [name] Treating Sheena [name]/Extender: Sheena [name], Sheena [name] Weeks in Treatment: 4 Clinic Level of Care Assessment Items TOOL 4 Quantity Score []  - Use when only an EandM is  performed on FOLLOW-UP visit 0 ASSESSMENTS - Nursing Assessment / Reassessment X - Reassessment of Co-morbidities (includes updates in patient status) 1 10 X- 1 5 Reassessment of Adherence to Treatment Plan ASSESSMENTS - Wound and Skin Assessment / Reassessment X - Simple Wound Assessment / Reassessment - one wound 1 5 []  - 0 Complex Wound Assessment / Reassessment - multiple wounds []  - 0 Dermatologic / Skin Assessment (not related to wound area) ASSESSMENTS - Focused Assessment []  - Circumferential Edema Measurements - multi extremities 0 []  - 0 Nutritional Assessment / Counseling / Intervention X- 1 5 Lower Extremity Assessment (monofilament, tuning fork, pulses) []  - 0 Peripheral Arterial Disease Assessment (using hand held doppler) ASSESSMENTS - Ostomy and/or Continence Assessment and Care []  - Incontinence Assessment and Management 0 []  - 0 Ostomy Care Assessment and Management (repouching, etc.) PROCESS - Coordination of Care X - Simple Patient / Family Education for ongoing care 1 15 []  - 0 Complex (extensive) Patient / Family Education for ongoing care X- 1 10 Staff obtains Programmer, systems, Records, Test Results / Process Orders []  - 0 Staff telephones HHA, Nursing Homes / Clarify orders / etc []  - 0 Routine Transfer to another Facility (non-emergent condition) []  - 0 Routine Hospital Admission (non-emergent condition) []  - 0 New Admissions / Biomedical engineer / Ordering NPWT, Apligraf, etc. []  - 0 Emergency Hospital Admission (emergent condition) X- 1 10 Simple Discharge Coordination Sheena [name]. ([id number]) []  - 0 Complex (extensive) Discharge Coordination PROCESS - Special Needs []  - Pediatric / Minor Patient Management 0 []  - 0 Isolation Patient Management []  - 0 Hearing / Language / Visual special needs []  - 0 Assessment of Community assistance (transportation, D/C planning, etc.) []  - 0 Additional assistance / Altered mentation []  -  0 Support Surface(s) Assessment (bed, cushion, seat, etc.) INTERVENTIONS - Wound Cleansing / Measurement X - Simple Wound Cleansing - one wound 1 5 []  - 0 Complex Wound Cleansing - multiple wounds  X- 1 5 Wound Imaging (photographs - any number of wounds) []  - 0 Wound Tracing (instead of photographs) X- 1 5 Simple Wound Measurement - one wound []  - 0 Complex Wound Measurement - multiple wounds INTERVENTIONS - Wound Dressings []  - Small Wound Dressing one or multiple wounds 0 []  - 0 Medium Wound Dressing one or multiple wounds []  - 0 Large Wound Dressing one or multiple wounds []  - 0 Application of Medications - topical []  - 0 Application of Medications - injection INTERVENTIONS - Miscellaneous []  - External ear exam 0 []  - 0 Specimen Collection (cultures, biopsies, blood, body fluids, etc.) []  - 0 Specimen(s) / Culture(s) sent or taken to Lab for analysis []  - 0 Patient Transfer (multiple staff / Civil Service fast streamer / Similar devices) []  - 0 Simple Staple / Suture removal (25 or less) []  - 0 Complex Staple / Suture removal (26 or more) []  - 0 Hypo / Hyperglycemic Management (close monitor of Blood Glucose) []  - 0 Ankle / Brachial Index (ABI) - do not check if billed separately X- 1 5 Vital Signs Sheena [name], Sheena [name]. ([id number]) Has the patient been seen at the hospital within the last three years: Yes Total Score: 80 Level Of Care: New/Established - Level 3 Electronic Signature(s) Signed: 12/12/2018 4:52:44 PM By: Sheena [name] Entered By: Sheena [name] on 12/12/2018 11:45:32 Sheena [name], Sheena [name]. ([id number]) -------------------------------------------------------------------------------- Encounter Discharge Information Details Patient Name: Sheena [name]. Date of Service: 12/12/2018 10:30 AM Medical Record Number: [id number] Patient Account Number: [id number] Date of Birth/Sex: [birth year] (78 y.o. F) Treating RN: Sheena [name] Primary Care Alonia Dibuono: Sheena [name]  Other Clinician: Referring Stephanny Tsutsui: Sheena [name] Treating Sheena [name]/Extender: Sheena [name], Sheena [name] Weeks in Treatment: 4 Encounter Discharge Information Items Discharge Condition: Stable Ambulatory Status: Ambulatory Discharge Destination: Home Transportation: Private Auto Accompanied By: self Schedule Follow-up Appointment: No Clinical Summary of Care: Electronic Signature(s) Signed: 12/12/2018 4:52:44 PM By: Sheena [name] Entered By: Sheena [name] on 12/12/2018 11:45:53 Vanhouten, Elisabet [name]. ([id number]) -------------------------------------------------------------------------------- Lower Extremity Assessment Details Patient Name: Sheena [name]. Date of Service: 12/12/2018 10:30 AM Medical Record Number: [id number] Patient Account Number: [id number] Date of Birth/Sex: 05-16-1941 (78 y.o. F) Treating RN: Army Melia Primary Care Raistlin Gum: Sheena [name] Other Clinician: Referring Kirstine Jacquin: Sheena [name] Treating Joee Iovine/Extender: Sheena [name], Sheena [name] Weeks in Treatment: 4 Edema Assessment Assessed: [Left: No] [Right: No] Edema: [Left: N] [Right: o] Calf Left: Right: Point of Measurement: 29 cm From Medial Instep 38 cm cm Ankle Left: Right: Point of Measurement: 14 cm From Medial Instep 23 cm cm Vascular Assessment Pulses: Posterior Tibial Extremity colors, hair growth, and conditions: Extremity Color: [Left:Mottled] Hair Growth on Extremity: [Left:No] Temperature of Extremity: [Left:Warm] Capillary Refill: [Left:< 3 seconds] Toe Nail Assessment Left: Right: Thick: Yes Discolored: Yes Deformed: No Improper Length and Hygiene: No Electronic Signature(s) Signed: 12/12/2018 4:35:39 PM By: Army Melia Entered By: Army Melia on 12/12/2018 10:49:19 Sheena [name] ([id number]) -------------------------------------------------------------------------------- [place] Details Patient Name: Sheena [name]. Date of Service: 12/12/2018 10:30  AM Medical Record Number: [id number] Patient Account Number: [id number] Date of Birth/Sex: [birth year] (78 y.o. F) Treating RN: Sheena [name] Primary Care Feige Lowdermilk: Sheena [name] Other Clinician: Referring Heer Justiss: Sheena [name] Treating Jazari Ober/Extender: Sheena [name], Sheena [name] Weeks in Treatment: 4 Active Inactive Electronic Signature(s) Signed: 12/12/2018 4:52:44 PM By: Sheena [name] Entered By: Sheena [name] on 12/12/2018 11:02:24 Sheena [name]. ([id number]) -------------------------------------------------------------------------------- [name] Assessment Details Patient Name: Sheena [name]. Date of Service: 12/12/2018 10:30 AM Medical Record Number: [id number] Patient Account Number: [id number] Date of  Birth/Sex: 09/21/1941 (78 y.o. F) Treating RN: Sheena [name] Primary Care Enis Leatherwood: Sheena [name] Other Clinician: Referring Eufemio Strahm: Sheena [name] Treating Makynna Manocchio/Extender: Sheena [name], Sheena [name] Weeks in Treatment: 4 Active Problems Location of [name] Severity and Description of [name] Patient Has Paino No Site Locations [name] Management and Medication Current [name] Management: Electronic Signature(s) Signed: 12/12/2018 3:52:51 PM By: Paulla Fore, RRT, CHT Signed: 12/12/2018 4:52:44 PM By: Sheena [name] Entered By: Sheena [name] on 12/12/2018 10:40:37 Sheena [name] ([id number]) -------------------------------------------------------------------------------- Patient/Caregiver Education Details Patient Name: Sheena [name]. Date of Service: 12/12/2018 10:30 AM Medical Record Number: [id number] Patient Account Number: [id number] Date of Birth/Gender: 1941/09/20 (78 y.o. F) Treating RN: Sheena [name] Primary Care Physician: Sheena [name] Other Clinician: Referring Physician: Glendon [name] Treating Physician/Extender: Sharalyn Ink in Treatment: 4 Education Assessment Education Provided To: Patient Education Topics  Provided Basic Hygiene: Handouts: Other: care of newly healed ulcer site Methods: Explain/Verbal Responses: State content correctly Electronic Signature(s) Signed: 12/12/2018 4:52:44 PM By: Sheena [name] Entered By: Sheena [name] on 12/12/2018 11:46:09 Sheena [name], Sheena [name]. ([id number]) -------------------------------------------------------------------------------- Wound Assessment Details Patient Name: Sheena [name]. Date of Service: 12/12/2018 10:30 AM Medical Record Number: [id number] Patient Account Number: [id number] Date of Birth/Sex: [birth year] (78 y.o. F) Treating RN: Sheena [name] Primary Care Bailey Kolbe: Sheena [name] Other Clinician: Referring Jaxyn Mestas: Sheena [name] Treating Isauro Skelley/Extender: Sheena [name], Sheena [name] Weeks in Treatment: 4 Wound Status Wound Number: 1 Primary Diabetic Wound/Ulcer of the Lower Extremity Etiology: Wound Location: Left, Midline, Anterior Lower Leg Wound Healed - Epithelialized Wounding Event: Surgical Injury Status: Date Acquired: 08/01/2018 Comorbid Cataracts, Anemia, Hypertension, Type II Weeks Of Treatment: 4 History: Diabetes, End Stage Renal Disease, Clustered Wound: No Neuropathy Photos Photo Uploaded By: Gretta Cool, BSN, RN, CWS, Kim on 12/15/2018 15:26:52 Wound Measurements Length: (cm) 0 % Re Width: (cm) 0 % Re Depth: (cm) 0 Epit Area: (cm) 0 Tun Volume: (cm) 0 Und duction in Area: 100% duction in Volume: 100% helialization: Small (1-33%) neling: No ermining: No Wound Description Classification: Grade 2 Wound Margin: Thickened Exudate Amount: Small Exudate Type: Serous Exudate Color: amber Foul Odor After Cleansing: No Slough/Fibrino No Wound Bed Granulation Amount: None Present (0%) Exposed Structure Necrotic Amount: Large (67-100%) Fascia Exposed: No Fat Layer (Subcutaneous Tissue) Exposed: Yes Tendon Exposed: No Muscle Exposed: No Joint Exposed: No Bone Exposed: No Periwound Skin Texture Sheena [name], Sheena  [name]. ([id number]) Texture Color No Abnormalities Noted: Yes No Abnormalities Noted: No Atrophie Blanche: No Moisture Cyanosis: No No Abnormalities Noted: Yes Ecchymosis: No Erythema: No Hemosiderin Staining: No Mottled: No Pallor: No Rubor: No Temperature / [name] Temperature: No Abnormality Tenderness on Palpation: Yes Wound Preparation Ulcer Cleansing: Rinsed/Irrigated with Saline Topical Anesthetic Applied: Other: lidocaine 4%, Electronic Signature(s) Signed: 12/12/2018 4:52:44 PM By: Sheena [name] Entered By: Sheena [name] on 12/12/2018 11:02:10 Sheena [name]. ([id number]) -------------------------------------------------------------------------------- Vitals Details Patient Name: Sheena [name]. Date of Service: 12/12/2018 10:30 AM Medical Record Number: [id number] Patient Account Number: [id number] Date of Birth/Sex: 1941-05-27 (78 y.o. F) Treating RN: Sheena [name] Primary Care Burman Bruington: Sheena [name] Other Clinician: Referring Kathe Wirick: Sheena [name] Treating Tomy Khim/Extender: Sheena [name], Sheena [name] Weeks in Treatment: 4 Vital Signs Time Taken: 10:40 Temperature (F): 98.1 Height (in): 61 Pulse (bpm): 73 Weight (lbs): 201.3 Respiratory Rate (breaths/min): 16 Body Mass Index (BMI): 38 Blood Pressure (mmHg): 129/52 Reference Range: 80 - 120 mg / dl Airway Electronic Signature(s) Signed: 12/12/2018 3:52:51 PM By: Sheena [name] RCP, RRT, CHT Entered By: Sheena [name] on 12/12/2018 10:43:20

## 2018-12-22 DIAGNOSIS — N183 Chronic kidney disease, stage 3 (moderate): Secondary | ICD-10-CM | POA: Diagnosis not present

## 2018-12-22 DIAGNOSIS — E1122 Type 2 diabetes mellitus with diabetic chronic kidney disease: Secondary | ICD-10-CM | POA: Diagnosis not present

## 2019-07-24 DIAGNOSIS — E039 Hypothyroidism, unspecified: Secondary | ICD-10-CM | POA: Diagnosis not present

## 2019-07-24 DIAGNOSIS — N183 Chronic kidney disease, stage 3 (moderate): Secondary | ICD-10-CM | POA: Diagnosis not present

## 2019-07-24 DIAGNOSIS — I10 Essential (primary) hypertension: Secondary | ICD-10-CM | POA: Diagnosis not present

## 2019-07-24 DIAGNOSIS — E1122 Type 2 diabetes mellitus with diabetic chronic kidney disease: Secondary | ICD-10-CM | POA: Diagnosis not present

## 2019-07-24 DIAGNOSIS — D649 Anemia, unspecified: Secondary | ICD-10-CM | POA: Diagnosis not present

## 2019-07-24 DIAGNOSIS — D508 Other iron deficiency anemias: Secondary | ICD-10-CM | POA: Diagnosis not present

## 2019-07-24 DIAGNOSIS — E7800 Pure hypercholesterolemia, unspecified: Secondary | ICD-10-CM | POA: Diagnosis not present

## 2019-07-31 DIAGNOSIS — Z1159 Encounter for screening for other viral diseases: Secondary | ICD-10-CM | POA: Diagnosis not present

## 2019-07-31 DIAGNOSIS — Z0000 Encounter for general adult medical examination without abnormal findings: Secondary | ICD-10-CM | POA: Insufficient documentation

## 2019-07-31 DIAGNOSIS — N183 Chronic kidney disease, stage 3 (moderate): Secondary | ICD-10-CM | POA: Diagnosis not present

## 2019-07-31 DIAGNOSIS — E1122 Type 2 diabetes mellitus with diabetic chronic kidney disease: Secondary | ICD-10-CM | POA: Diagnosis not present

## 2019-07-31 DIAGNOSIS — I10 Essential (primary) hypertension: Secondary | ICD-10-CM | POA: Diagnosis not present

## 2019-07-31 DIAGNOSIS — E7800 Pure hypercholesterolemia, unspecified: Secondary | ICD-10-CM | POA: Diagnosis not present

## 2019-07-31 DIAGNOSIS — E039 Hypothyroidism, unspecified: Secondary | ICD-10-CM | POA: Diagnosis not present

## 2019-08-28 DIAGNOSIS — E113293 Type 2 diabetes mellitus with mild nonproliferative diabetic retinopathy without macular edema, bilateral: Secondary | ICD-10-CM | POA: Diagnosis not present

## 2019-09-03 DIAGNOSIS — E113291 Type 2 diabetes mellitus with mild nonproliferative diabetic retinopathy without macular edema, right eye: Secondary | ICD-10-CM | POA: Insufficient documentation

## 2019-09-06 ENCOUNTER — Other Ambulatory Visit: Payer: Self-pay | Admitting: Family Medicine

## 2019-09-06 DIAGNOSIS — Z1231 Encounter for screening mammogram for malignant neoplasm of breast: Secondary | ICD-10-CM

## 2019-10-24 DIAGNOSIS — I10 Essential (primary) hypertension: Secondary | ICD-10-CM | POA: Diagnosis not present

## 2019-10-24 DIAGNOSIS — Z1159 Encounter for screening for other viral diseases: Secondary | ICD-10-CM | POA: Diagnosis not present

## 2019-10-24 DIAGNOSIS — E7800 Pure hypercholesterolemia, unspecified: Secondary | ICD-10-CM | POA: Diagnosis not present

## 2019-10-24 DIAGNOSIS — E1122 Type 2 diabetes mellitus with diabetic chronic kidney disease: Secondary | ICD-10-CM | POA: Diagnosis not present

## 2019-10-24 DIAGNOSIS — N183 Chronic kidney disease, stage 3 unspecified: Secondary | ICD-10-CM | POA: Diagnosis not present

## 2019-10-31 DIAGNOSIS — Z66 Do not resuscitate: Secondary | ICD-10-CM | POA: Insufficient documentation

## 2019-10-31 DIAGNOSIS — I10 Essential (primary) hypertension: Secondary | ICD-10-CM | POA: Diagnosis not present

## 2019-10-31 DIAGNOSIS — E1121 Type 2 diabetes mellitus with diabetic nephropathy: Secondary | ICD-10-CM | POA: Diagnosis not present

## 2019-10-31 DIAGNOSIS — N1832 Chronic kidney disease, stage 3b: Secondary | ICD-10-CM | POA: Diagnosis not present

## 2019-10-31 DIAGNOSIS — Z0000 Encounter for general adult medical examination without abnormal findings: Secondary | ICD-10-CM | POA: Diagnosis not present

## 2019-10-31 DIAGNOSIS — E7800 Pure hypercholesterolemia, unspecified: Secondary | ICD-10-CM | POA: Diagnosis not present

## 2019-12-26 DIAGNOSIS — E119 Type 2 diabetes mellitus without complications: Secondary | ICD-10-CM | POA: Diagnosis not present

## 2019-12-26 DIAGNOSIS — M79672 Pain in left foot: Secondary | ICD-10-CM | POA: Diagnosis not present

## 2019-12-26 DIAGNOSIS — M7662 Achilles tendinitis, left leg: Secondary | ICD-10-CM | POA: Diagnosis not present

## 2019-12-26 DIAGNOSIS — M7732 Calcaneal spur, left foot: Secondary | ICD-10-CM | POA: Diagnosis not present

## 2019-12-28 ENCOUNTER — Ambulatory Visit
Admission: RE | Admit: 2019-12-28 | Discharge: 2019-12-28 | Disposition: A | Discharge status: Home / Self Care | Payer: PPO | Source: Ambulatory Visit | Attending: Family Medicine | Admitting: Family Medicine

## 2019-12-28 DIAGNOSIS — Z1231 Encounter for screening mammogram for malignant neoplasm of breast: Secondary | ICD-10-CM

## 2020-04-23 DIAGNOSIS — E1122 Type 2 diabetes mellitus with diabetic chronic kidney disease: Secondary | ICD-10-CM | POA: Diagnosis not present

## 2020-04-23 DIAGNOSIS — N1832 Chronic kidney disease, stage 3b: Secondary | ICD-10-CM | POA: Diagnosis not present

## 2020-04-23 DIAGNOSIS — I10 Essential (primary) hypertension: Secondary | ICD-10-CM | POA: Diagnosis not present

## 2020-04-23 DIAGNOSIS — E7800 Pure hypercholesterolemia, unspecified: Secondary | ICD-10-CM | POA: Diagnosis not present

## 2020-04-23 IMAGING — MG DIGITAL SCREENING BILAT W/ TOMO W/ CAD
6 of 12 series · 6 of 36 positions shown · non-contrast
Comparison: Previous exam(s).

CLINICAL DATA: Screening.

EXAM:
DIGITAL SCREENING BILATERAL MAMMOGRAM WITH TOMO AND CAD

[R MLO synth-2D (1 of 2)]
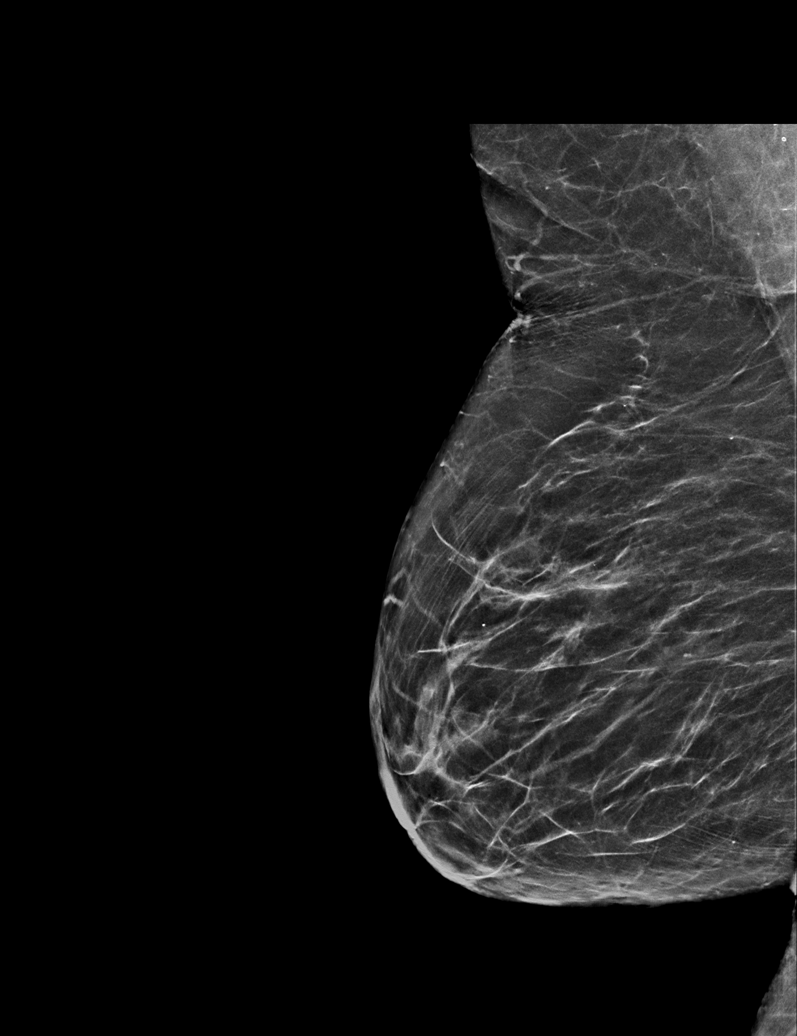

[L MLO synth-2D (1 of 2)]
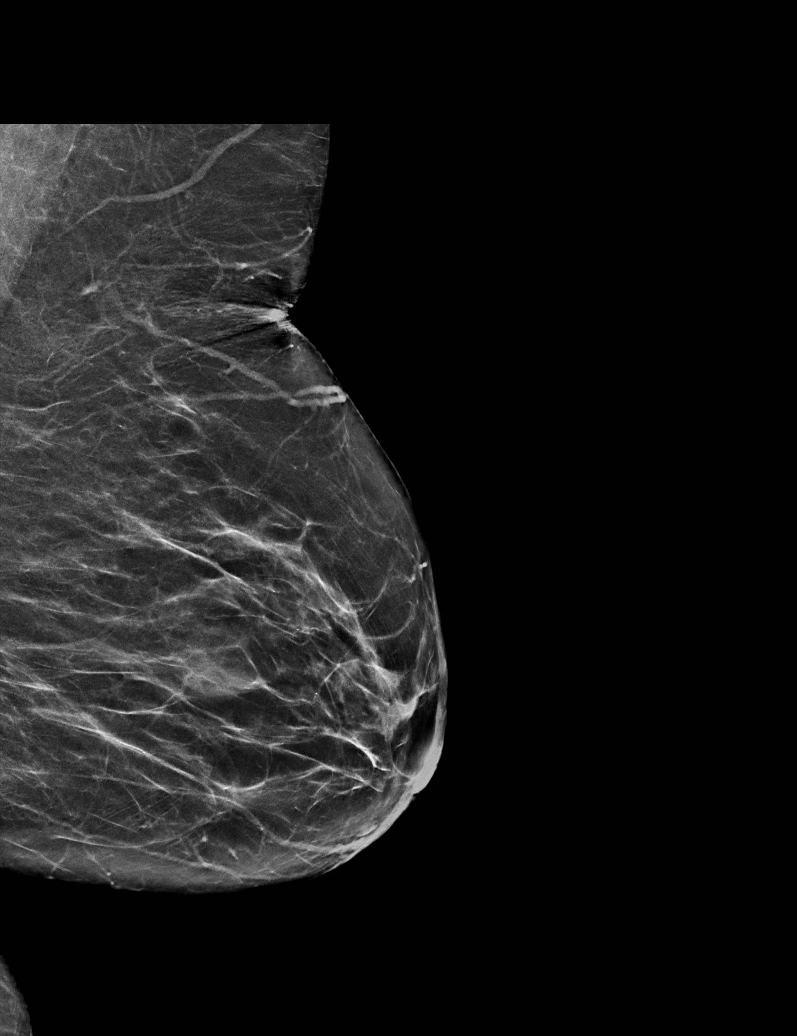

[R CC synth-2D]
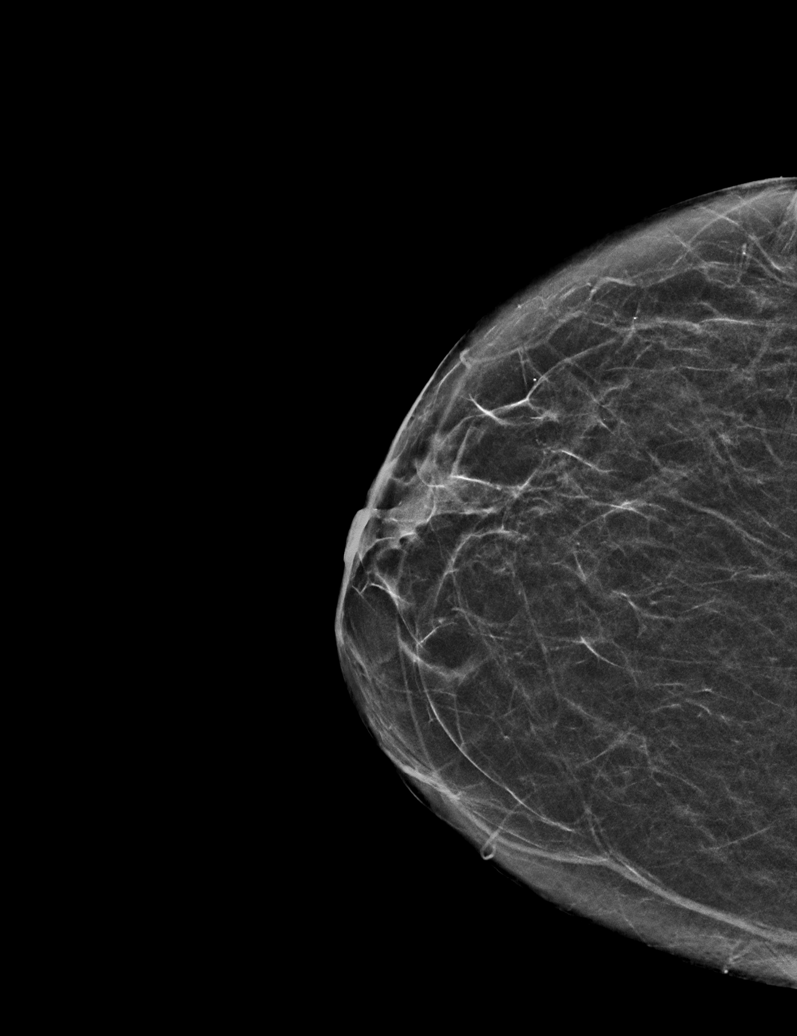

[L MLO synth-2D (2 of 2)]
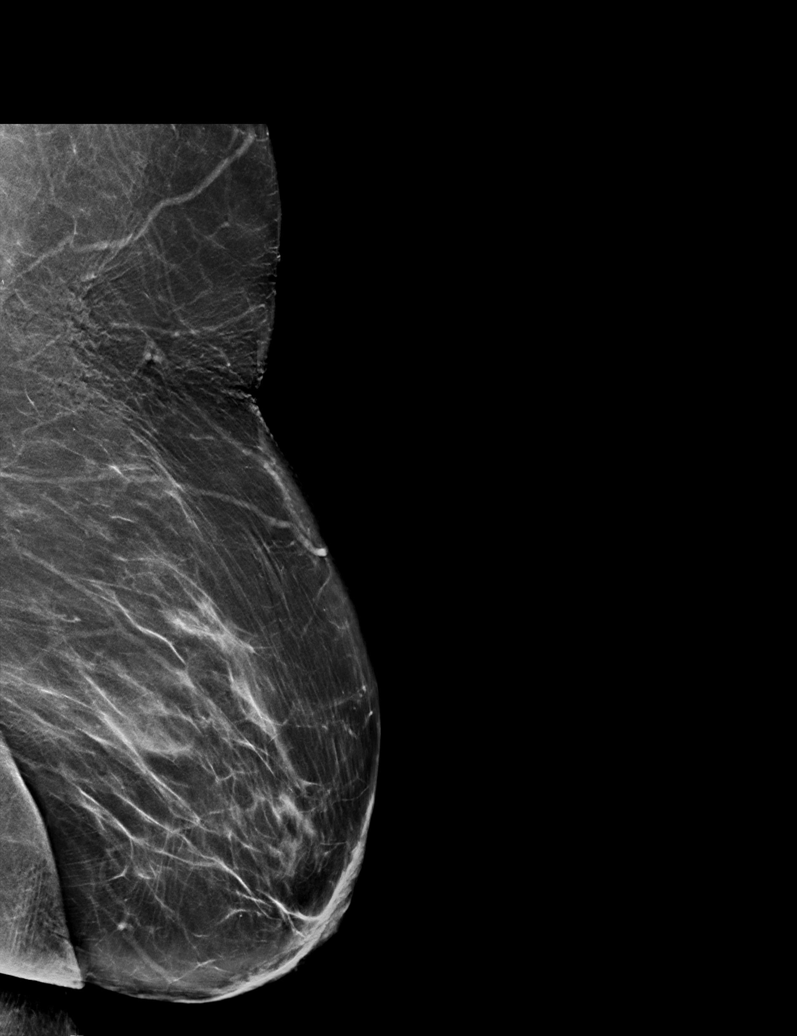

[R MLO synth-2D (2 of 2)]
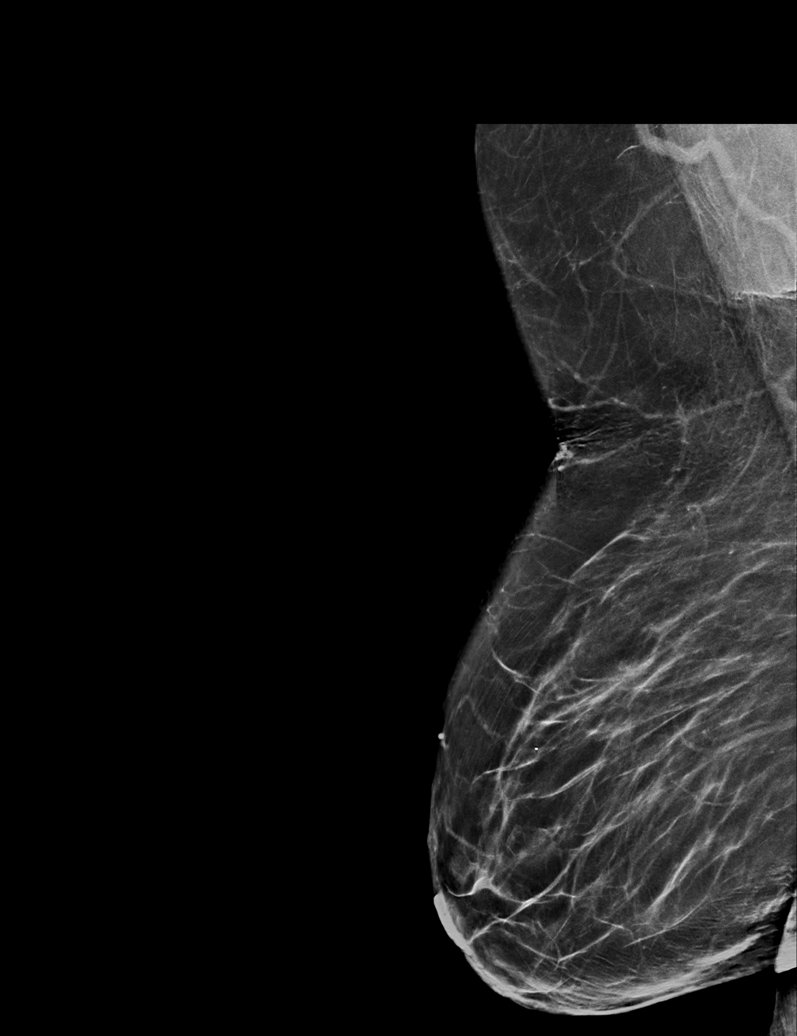

[L CC synth-2D]
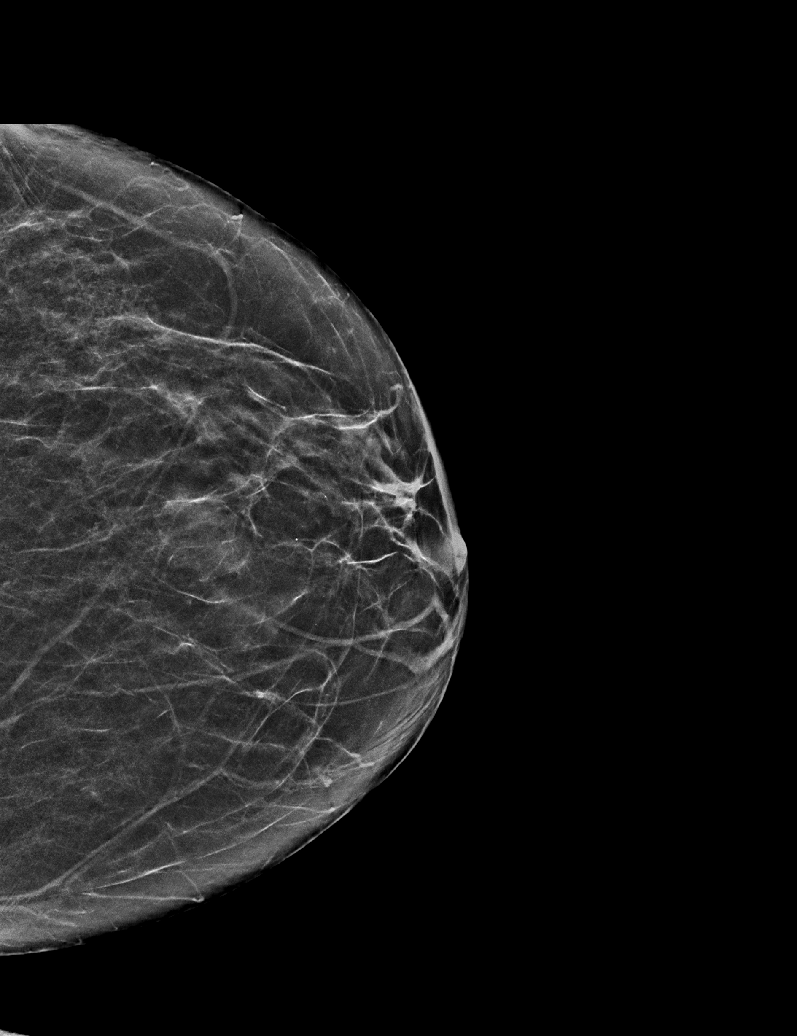

[6 of 36 positions shown; findings below may reference images not displayed]

ACR Breast Density Category b: There are scattered areas of
fibroglandular density.
FINDINGS: There are no findings suspicious for malignancy. Images were
processed with CAD.
IMPRESSION: No mammographic evidence of malignancy. A result letter of this
screening mammogram will be mailed directly to the patient.

RECOMMENDATION:
Screening mammogram in one year. (Code:[id number])

BI-RADS CATEGORY  1: Negative.

## 2020-04-30 DIAGNOSIS — I10 Essential (primary) hypertension: Secondary | ICD-10-CM | POA: Diagnosis not present

## 2020-04-30 DIAGNOSIS — E7800 Pure hypercholesterolemia, unspecified: Secondary | ICD-10-CM | POA: Diagnosis not present

## 2020-04-30 DIAGNOSIS — E1122 Type 2 diabetes mellitus with diabetic chronic kidney disease: Secondary | ICD-10-CM | POA: Diagnosis not present

## 2020-04-30 DIAGNOSIS — N183 Chronic kidney disease, stage 3 unspecified: Secondary | ICD-10-CM | POA: Diagnosis not present

## 2020-04-30 DIAGNOSIS — E039 Hypothyroidism, unspecified: Secondary | ICD-10-CM | POA: Diagnosis not present

## 2020-06-02 DIAGNOSIS — M7662 Achilles tendinitis, left leg: Secondary | ICD-10-CM | POA: Diagnosis not present

## 2020-06-02 DIAGNOSIS — M7732 Calcaneal spur, left foot: Secondary | ICD-10-CM | POA: Diagnosis not present

## 2020-06-02 DIAGNOSIS — M10071 Idiopathic gout, right ankle and foot: Secondary | ICD-10-CM | POA: Diagnosis not present

## 2020-06-23 DIAGNOSIS — M7732 Calcaneal spur, left foot: Secondary | ICD-10-CM | POA: Diagnosis not present

## 2020-06-23 DIAGNOSIS — Z8739 Personal history of other diseases of the musculoskeletal system and connective tissue: Secondary | ICD-10-CM | POA: Diagnosis not present

## 2020-06-23 DIAGNOSIS — M10071 Idiopathic gout, right ankle and foot: Secondary | ICD-10-CM | POA: Diagnosis not present

## 2020-06-23 DIAGNOSIS — M7662 Achilles tendinitis, left leg: Secondary | ICD-10-CM | POA: Diagnosis not present

## 2020-06-23 DIAGNOSIS — E119 Type 2 diabetes mellitus without complications: Secondary | ICD-10-CM | POA: Diagnosis not present

## 2020-07-22 DIAGNOSIS — H0012 Chalazion right lower eyelid: Secondary | ICD-10-CM | POA: Diagnosis not present

## 2020-08-05 DIAGNOSIS — N183 Chronic kidney disease, stage 3 unspecified: Secondary | ICD-10-CM | POA: Diagnosis not present

## 2020-08-05 DIAGNOSIS — Z8739 Personal history of other diseases of the musculoskeletal system and connective tissue: Secondary | ICD-10-CM | POA: Diagnosis not present

## 2020-08-05 DIAGNOSIS — E039 Hypothyroidism, unspecified: Secondary | ICD-10-CM | POA: Diagnosis not present

## 2020-08-29 DIAGNOSIS — H353131 Nonexudative age-related macular degeneration, bilateral, early dry stage: Secondary | ICD-10-CM | POA: Diagnosis not present

## 2020-11-04 DIAGNOSIS — Z8739 Personal history of other diseases of the musculoskeletal system and connective tissue: Secondary | ICD-10-CM | POA: Diagnosis not present

## 2020-11-04 DIAGNOSIS — E7800 Pure hypercholesterolemia, unspecified: Secondary | ICD-10-CM | POA: Diagnosis not present

## 2020-11-04 DIAGNOSIS — N183 Chronic kidney disease, stage 3 unspecified: Secondary | ICD-10-CM | POA: Diagnosis not present

## 2020-11-04 DIAGNOSIS — E1122 Type 2 diabetes mellitus with diabetic chronic kidney disease: Secondary | ICD-10-CM | POA: Diagnosis not present

## 2020-11-04 DIAGNOSIS — I10 Essential (primary) hypertension: Secondary | ICD-10-CM | POA: Diagnosis not present

## 2020-11-11 DIAGNOSIS — M19011 Primary osteoarthritis, right shoulder: Secondary | ICD-10-CM | POA: Diagnosis not present

## 2020-11-11 DIAGNOSIS — E1122 Type 2 diabetes mellitus with diabetic chronic kidney disease: Secondary | ICD-10-CM | POA: Diagnosis not present

## 2020-11-11 DIAGNOSIS — Z8739 Personal history of other diseases of the musculoskeletal system and connective tissue: Secondary | ICD-10-CM | POA: Diagnosis not present

## 2020-11-11 DIAGNOSIS — I10 Essential (primary) hypertension: Secondary | ICD-10-CM | POA: Diagnosis not present

## 2020-11-11 DIAGNOSIS — L989 Disorder of the skin and subcutaneous tissue, unspecified: Secondary | ICD-10-CM | POA: Diagnosis not present

## 2020-11-11 DIAGNOSIS — N1831 Chronic kidney disease, stage 3a: Secondary | ICD-10-CM | POA: Diagnosis not present

## 2020-11-11 DIAGNOSIS — S42254A Nondisplaced fracture of greater tuberosity of right humerus, initial encounter for closed fracture: Secondary | ICD-10-CM | POA: Diagnosis not present

## 2020-11-11 DIAGNOSIS — M25511 Pain in right shoulder: Secondary | ICD-10-CM | POA: Diagnosis not present

## 2020-11-18 DIAGNOSIS — M7541 Impingement syndrome of right shoulder: Secondary | ICD-10-CM | POA: Diagnosis not present

## 2020-11-18 DIAGNOSIS — M7581 Other shoulder lesions, right shoulder: Secondary | ICD-10-CM | POA: Diagnosis not present

## 2020-11-18 DIAGNOSIS — M19011 Primary osteoarthritis, right shoulder: Secondary | ICD-10-CM | POA: Diagnosis not present

## 2020-11-18 DIAGNOSIS — M7551 Bursitis of right shoulder: Secondary | ICD-10-CM | POA: Diagnosis not present

## 2020-11-18 DIAGNOSIS — G8929 Other chronic pain: Secondary | ICD-10-CM | POA: Diagnosis not present

## 2020-11-18 DIAGNOSIS — M7501 Adhesive capsulitis of right shoulder: Secondary | ICD-10-CM | POA: Diagnosis not present

## 2020-11-18 DIAGNOSIS — M25511 Pain in right shoulder: Secondary | ICD-10-CM | POA: Diagnosis not present

## 2020-11-25 DIAGNOSIS — M25511 Pain in right shoulder: Secondary | ICD-10-CM | POA: Diagnosis not present

## 2020-11-27 DIAGNOSIS — M25511 Pain in right shoulder: Secondary | ICD-10-CM | POA: Diagnosis not present

## 2020-12-02 DIAGNOSIS — M25511 Pain in right shoulder: Secondary | ICD-10-CM | POA: Diagnosis not present

## 2020-12-04 DIAGNOSIS — M25511 Pain in right shoulder: Secondary | ICD-10-CM | POA: Diagnosis not present

## 2020-12-09 DIAGNOSIS — M25511 Pain in right shoulder: Secondary | ICD-10-CM | POA: Diagnosis not present

## 2020-12-16 DIAGNOSIS — M25511 Pain in right shoulder: Secondary | ICD-10-CM | POA: Diagnosis not present

## 2020-12-16 DIAGNOSIS — M7581 Other shoulder lesions, right shoulder: Secondary | ICD-10-CM | POA: Diagnosis not present

## 2020-12-16 DIAGNOSIS — G8929 Other chronic pain: Secondary | ICD-10-CM | POA: Diagnosis not present

## 2020-12-16 DIAGNOSIS — M19011 Primary osteoarthritis, right shoulder: Secondary | ICD-10-CM | POA: Diagnosis not present

## 2020-12-16 DIAGNOSIS — M7541 Impingement syndrome of right shoulder: Secondary | ICD-10-CM | POA: Diagnosis not present

## 2020-12-16 DIAGNOSIS — M7551 Bursitis of right shoulder: Secondary | ICD-10-CM | POA: Diagnosis not present

## 2020-12-16 DIAGNOSIS — M7501 Adhesive capsulitis of right shoulder: Secondary | ICD-10-CM | POA: Diagnosis not present

## 2020-12-23 DIAGNOSIS — E6609 Other obesity due to excess calories: Secondary | ICD-10-CM | POA: Diagnosis not present

## 2020-12-23 DIAGNOSIS — E1122 Type 2 diabetes mellitus with diabetic chronic kidney disease: Secondary | ICD-10-CM | POA: Diagnosis not present

## 2020-12-23 DIAGNOSIS — M25511 Pain in right shoulder: Secondary | ICD-10-CM | POA: Diagnosis not present

## 2020-12-23 DIAGNOSIS — Z0000 Encounter for general adult medical examination without abnormal findings: Secondary | ICD-10-CM | POA: Diagnosis not present

## 2020-12-23 DIAGNOSIS — N1831 Chronic kidney disease, stage 3a: Secondary | ICD-10-CM | POA: Diagnosis not present

## 2020-12-23 DIAGNOSIS — Z8739 Personal history of other diseases of the musculoskeletal system and connective tissue: Secondary | ICD-10-CM | POA: Diagnosis not present

## 2020-12-23 DIAGNOSIS — Z23 Encounter for immunization: Secondary | ICD-10-CM | POA: Diagnosis not present

## 2020-12-23 DIAGNOSIS — E039 Hypothyroidism, unspecified: Secondary | ICD-10-CM | POA: Diagnosis not present

## 2020-12-23 DIAGNOSIS — I10 Essential (primary) hypertension: Secondary | ICD-10-CM | POA: Diagnosis not present

## 2020-12-23 DIAGNOSIS — E7800 Pure hypercholesterolemia, unspecified: Secondary | ICD-10-CM | POA: Diagnosis not present

## 2020-12-23 DIAGNOSIS — D508 Other iron deficiency anemias: Secondary | ICD-10-CM | POA: Diagnosis not present

## 2020-12-23 DIAGNOSIS — Z6830 Body mass index (BMI) 30.0-30.9, adult: Secondary | ICD-10-CM | POA: Diagnosis not present

## 2020-12-26 DIAGNOSIS — M25511 Pain in right shoulder: Secondary | ICD-10-CM | POA: Diagnosis not present

## 2021-01-13 ENCOUNTER — Other Ambulatory Visit: Payer: Self-pay

## 2021-01-13 ENCOUNTER — Ambulatory Visit: Payer: PPO | Admitting: Dermatology

## 2021-01-13 ENCOUNTER — Encounter: Payer: Self-pay | Admitting: Dermatology

## 2021-01-13 DIAGNOSIS — L578 Other skin changes due to chronic exposure to nonionizing radiation: Secondary | ICD-10-CM | POA: Diagnosis not present

## 2021-01-13 DIAGNOSIS — L821 Other seborrheic keratosis: Secondary | ICD-10-CM | POA: Diagnosis not present

## 2021-01-13 DIAGNOSIS — L570 Actinic keratosis: Secondary | ICD-10-CM | POA: Diagnosis not present

## 2021-01-13 DIAGNOSIS — L409 Psoriasis, unspecified: Secondary | ICD-10-CM | POA: Diagnosis not present

## 2021-01-13 DIAGNOSIS — L719 Rosacea, unspecified: Secondary | ICD-10-CM | POA: Diagnosis not present

## 2021-01-13 MED ORDER — DOXYCYCLINE HYCLATE 20 MG PO TABS: 20.0000 mg | ORAL_TABLET | Freq: Two times a day (BID) | ORAL | 2 refills | Status: DC

## 2021-01-13 MED ORDER — DESOXIMETASONE 0.05 % EX OINT: TOPICAL_OINTMENT | CUTANEOUS | 2 refills | Status: DC

## 2021-01-13 NOTE — Patient Instructions (Addendum)
Recommend taking Heliocare sun protection supplement daily in sunny weather for additional sun protection. For maximum protection on the sunniest days, you can take up to 2 capsules of regular Heliocare OR take 1 capsule of Heliocare Ultra. For prolonged exposure (such as a full day in the sun), you can repeat your dose of the supplement 4 hours after your first dose. Heliocare can be purchased at [facility] or at [email or web address].   Psoriasis is a chronic non-curable, but treatable genetic/hereditary disease that may have other systemic features affecting other organ systems such as joints (Psoriatic Arthritis). It is associated with an increased risk of inflammatory bowel disease, heart disease, non-alcoholic fatty liver disease, and depression.    Rosacea  What is rosacea? Rosacea (say: ro-zay-sha) is a common skin disease that usually begins as a trend of flushing or blushing easily.  As rosacea progresses, a persistent redness in the center of the face will develop and may gradually spread beyond the nose and cheeks to the forehead and chin.  In some cases, the ears, chest, and back could be affected.  Rosacea may appear as tiny blood vessels or small red bumps that occur in crops.  Frequently they can contain pus, and are called "pustules".  If the bumps do not contain pus, they are referred to as "papules".  Rarely, in prolonged, untreated cases of rosacea, the oil glands of the nose and cheeks may become permanently enlarged.  This is called rhinophyma, and is seen more frequently in men.  Signs and Risks In its beginning stages, rosacea tends to come and go, which makes it difficult to recognize.  It can start as intermittent flushing of the face.  Eventually, blood vessels may become permanently visible.  Pustules and papules can appear, but can be mistaken for adult acne.  People of all races, ages, genders and ethnic groups are at risk of developing rosacea.  However, it is more  common in women (especially around menopause) and adults with fair skin between the ages of 33 and 72.  Treatment Dermatologists typically recommend a combination of treatments to effectively manage rosacea.  Treatment can improve symptoms and may stop the progression of the rosacea.  Treatment may involve both topical and oral medications.  The tetracycline antibiotics are often used for their anti-inflammatory effect; however, because of the possibility of developing antibiotic resistance, they should not be used long term at full dose.  For dilated blood vessels the options include electrodessication (uses electric current through a small needle), laser treatment, and cosmetics to hide the redness.   With all forms of treatment, improvement is a slow process, and patients may not see any results for the first 3-4 weeks.  It is very important to avoid the sun and other triggers.  Patients must wear sunscreen daily.  Skin Care Instructions: 1. Cleanse the skin with a mild soap such as CeraVe cleanser, Cetaphil cleanser, or Dove soap once or twice daily as needed. 2. Moisturize with Eucerin Redness Relief Daily Perfecting Lotion (has a subtle green tint), CeraVe Moisturizing Cream, or Oil of Olay Daily Moisturizer with sunscreen every morning and/or night as recommended. 3. Makeup should be "non-comedogenic" (won't clog pores) and be labeled "for sensitive skin". Good choices for cosmetics are: Neutrogena, Almay, and Physician's Formula.  Any product with a green tint tends to offset a red complexion. 4. If your eyes are dry and irritated, use artificial tears 2-3 times per day and cleanse the eyelids daily with baby shampoo.  Have your eyes examined at least every 2 years.  Be sure to tell your eye doctor that you have rosacea. 5. Alcoholic beverages tend to cause flushing of the skin, and may make rosacea worse. 6. Always wear sunscreen, protect your skin from extreme hot and cold temperatures, and  avoid spicy foods, hot drinks, and mechanical irritation such as rubbing, scrubbing, or massaging the face.  Avoid harsh skin cleansers, cleansing masks, astringents, and exfoliation. If a particular product burns or makes your face feel tight, then it is likely to flare your rosacea. 7. If you are having difficulty finding a sunscreen that you can tolerate, you may try switching to a chemical-free sunscreen.  These are ones whose active ingredient is zinc oxide or titanium dioxide only.  They should also be fragrance free, non-comedogenic, and labeled for sensitive skin. 8. Rosacea triggers may vary from person to person.  There are a variety of foods that have been reported to trigger rosacea.  Some patients find that keeping a diary of what they were doing when they flared helps them avoid triggers.  Topical steroids (such as triamcinolone, fluocinolone, fluocinonide, mometasone, clobetasol, halobetasol, betamethasone, hydrocortisone) can cause thinning and lightening of the skin if they are used for too long in the same area. Your physician has selected the right strength medicine for your problem and area affected on the body. Please use your medication only as directed by your physician to prevent side effects. Avoid applying to face, groin, and axilla. Use as directed. Risk of skin atrophy with long-term use reviewed.    Doxycycline should be taken with food to prevent nausea. Do not lay down for 30 minutes after taking. Be cautious with sun exposure and use good sun protection while on this medication. Pregnant women should not take this medication.   Prior to procedure, discussed risks of blister formation, small wound, skin dyspigmentation, or rare scar following cryotherapy.   Cryotherapy Aftercare  . Wash gently with soap and water everyday.   Marland Kitchen Apply Vaseline and Band-Aid daily until healed.

## 2021-01-13 NOTE — Progress Notes (Signed)
New Patient Visit  Subjective  Sheena [name] is a 80 y.o. female who presents for the following: New Patient (Initial Visit) (Patient is here today concerning little red spots that started on legs but have spread to other parts of the body. She was prescribed tmc 0.1 % ointment and states it helped some. Patient also states she has some crusty spots on back. 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 ).  Patient states she has had spots on legs for a few weeks. She report crusty spots on back have been there for some time.   Objective  Well appearing patient in no apparent distress; mood and affect are within normal limits.  A focused examination was performed including face, neck, chest and back and bilateral legs, bilateral arms, abdomen. Relevant physical exam findings are noted in the Assessment and Plan.  Objective  low back, buttock, abdomen, legs: Well-demarcated erythematous plaques at low back and buttock and scattered at abdomen and legs.  Objective  face: Mid face erythema with telangiectasias + inflammatory papules  Objective  anterior neck x 1 , right preauricular x 1, right lateral eyebrow x 1 (3): Erythematous thin papules/macules with gritty scale.   Assessment & Plan   Seborrheic Keratoses - Stuck-on, waxy, tan-brown papules and plaques  - Discussed benign etiology and prognosis. - Observe - Call for any changes  Psoriasis low back, buttock, abdomen, legs  Chronic condition with expected duration over one year. Condition is bothersome to patient. Currently flared.  Psoriasis is a chronic non-curable, but treatable genetic/hereditary disease that may have other systemic features affecting other organ systems such as joints (Psoriatic Arthritis). It is associated with an increased risk of inflammatory bowel disease, heart disease, non-alcoholic fatty liver disease, and depression.    Given suspected allergic contact dermatitis to hydrocortisone  by history, Start Desoximetasone 0.05 % ointment apply to affected areas of back, buttocks, abdomen, lower legs, twice a day for 2 weeks and after that only on weekends only.Avoid applying to face, groin, and axilla. Use as directed. Risk of skin atrophy with long-term use reviewed.   Topical steroids (such as triamcinolone, fluocinolone, fluocinonide, mometasone, clobetasol, halobetasol, betamethasone, hydrocortisone) can cause thinning and lightening of the skin if they are used for too long in the same area. Your physician has selected the right strength medicine for your problem and area affected on the body. Please use your medication only as directed by your physician to prevent side effects.   Rosacea face  Chronic condition with duration of 1-2 years.  Condition is bothersome to patient. Currently flared.  Rosacea is a chronic progressive skin condition usually affecting the face of adults, causing redness and/or acne bumps. It is treatable but not curable. It sometimes affects the eyes (ocular rosacea) as well. It may respond to topical and/or systemic medication and can flare with stress, sun exposure, alcohol, exercise and some foods.  Daily application of broad spectrum spf 30+ sunscreen to face is recommended to reduce flares.  Start doxycycline 20 mg by mouth twice daily with food.   Doxycycline should be taken with food to prevent nausea. Do not lay down for 30 minutes after taking. Be cautious with sun exposure and use good sun protection while on this medication. Pregnant women should not take this medication.     Ordered Medications: doxycycline (PERIOSTAT) 20 MG tablet  Actinic keratosis (3) anterior neck x 1 , right preauricular x 1, right lateral eyebrow x 1  Prior  to procedure, discussed risks of blister formation, small wound, skin dyspigmentation, or rare scar following cryotherapy.    Destruction of lesion - anterior neck x 1 , right preauricular x 1, right lateral  eyebrow x 1  Destruction method: cryotherapy   Informed consent: discussed and consent obtained   Lesion destroyed using liquid nitrogen: Yes   Cryotherapy cycles:  2 Outcome: patient tolerated procedure well with no complications   Post-procedure details: wound care instructions given     Actinic Damage - Severe, chronic, not at goal, secondary to cumulative UV radiation exposure over time - diffuse scaly erythematous macules and papules with underlying dyspigmentation - Discussed Prescription "Field Treatment" for Severe, Chronic Confluent Actinic Changes with Pre-Cancerous Actinic Keratoses Field treatment involves treatment of an entire area of skin that has confluent Actinic Changes (Sun/ Ultraviolet light damage) and PreCancerous Actinic Keratoses by method of PhotoDynamic Therapy (PDT) and/or prescription Topical Chemotherapy agents such as 5-fluorouracil, 5-fluorouracil/calcipotriene, and/or imiquimod.  The purpose is to decrease the number of clinically evident and subclinical PreCancerous lesions to prevent progression to development of skin cancer by chemically destroying early precancer changes that may or may not be visible.  It has been shown to reduce the risk of developing skin cancer in the treated area. As a result of treatment, redness, scaling, crusting, and open sores may occur during treatment course. One or more than one of these methods may be used and may have to be used several times to control, suppress and eliminate the PreCancerous changes. Discussed treatment course, expected reaction, and possible side effects. - Recommend daily broad spectrum sunscreen SPF 30+ to sun-exposed areas, reapply every 2 hours as needed.  - Staying in the shade or wearing long sleeves, sun glasses (UVA+UVB protection) and wide brim hats (4-inch brim around the entire circumference of the hat) are also recommended. - Call for new or changing lesions. - Will schedule photodynamic therapy to  the bilateral arms for 2 rounds    Return in about 2 months (around 03/15/2021) for follow up on psoriasis, rosacea, aks s/p pdt .  I, Ruthell Rummage, CMA, am acting as scribe for Forest Gleason, MD.  Documentation: I have reviewed the above documentation for accuracy and completeness, and I agree with the above.  Forest Gleason, MD

## 2021-01-26 ENCOUNTER — Other Ambulatory Visit: Payer: Self-pay

## 2021-01-26 ENCOUNTER — Ambulatory Visit: Payer: PPO

## 2021-01-26 DIAGNOSIS — L570 Actinic keratosis: Secondary | ICD-10-CM | POA: Diagnosis not present

## 2021-01-26 MED ORDER — AMINOLEVULINIC ACID HCL 20 % EX SOLR
2.0000 "application " | Freq: Once | CUTANEOUS | Status: AC
  Administered 2021-01-26: 708 mg via TOPICAL

## 2021-01-26 NOTE — Patient Instructions (Signed)

## 2021-01-26 NOTE — Progress Notes (Signed)
1. AK (actinic keratosis) bilateral arms  Photodynamic therapy - bilateral arms Procedure discussed: discussed risks, benefits, side effects. and alternatives   Prep: site scrubbed/prepped with acetone   Location:  Bilateral arms Number of lesions:  Multiple Type of treatment:  Blue light Aminolevulinic Acid (see MAR for details): Levulan Number of Levulan sticks used:  2 Incubation time (minutes):  120 Number of minutes under lamp:  16 Number of seconds under lamp:  40 Cooling:  Floor fan Outcome: patient tolerated procedure well with no complications   Post-procedure details: sunscreen applied and aftercare instructions given to patient    Aminolevulinic Acid HCl 20 % SOLR 708 mg - bilateral arms

## 2021-01-27 DIAGNOSIS — M7501 Adhesive capsulitis of right shoulder: Secondary | ICD-10-CM | POA: Diagnosis not present

## 2021-01-27 DIAGNOSIS — M25511 Pain in right shoulder: Secondary | ICD-10-CM | POA: Diagnosis not present

## 2021-01-27 DIAGNOSIS — G8929 Other chronic pain: Secondary | ICD-10-CM | POA: Diagnosis not present

## 2021-01-27 DIAGNOSIS — M19011 Primary osteoarthritis, right shoulder: Secondary | ICD-10-CM | POA: Diagnosis not present

## 2021-01-27 DIAGNOSIS — M7541 Impingement syndrome of right shoulder: Secondary | ICD-10-CM | POA: Diagnosis not present

## 2021-02-25 ENCOUNTER — Ambulatory Visit: Payer: PPO | Admitting: Dermatology

## 2021-02-26 ENCOUNTER — Ambulatory Visit (INDEPENDENT_AMBULATORY_CARE_PROVIDER_SITE_OTHER): Payer: PPO | Admitting: Dermatology

## 2021-02-26 ENCOUNTER — Telehealth: Payer: Self-pay

## 2021-02-26 ENCOUNTER — Other Ambulatory Visit: Payer: Self-pay

## 2021-02-26 ENCOUNTER — Ambulatory Visit: Payer: PPO

## 2021-02-26 DIAGNOSIS — L578 Other skin changes due to chronic exposure to nonionizing radiation: Secondary | ICD-10-CM | POA: Diagnosis not present

## 2021-02-26 DIAGNOSIS — L570 Actinic keratosis: Secondary | ICD-10-CM | POA: Diagnosis not present

## 2021-02-26 DIAGNOSIS — L409 Psoriasis, unspecified: Secondary | ICD-10-CM | POA: Diagnosis not present

## 2021-02-26 MED ORDER — AMINOLEVULINIC ACID HCL 20 % EX SOLR
1.0000 "application " | Freq: Once | CUTANEOUS | Status: AC
  Administered 2021-02-26: 354 mg via TOPICAL

## 2021-02-26 MED ORDER — CLOBETASOL PROPIONATE 0.05 % EX FOAM: CUTANEOUS | 1 refills | Status: DC

## 2021-02-26 NOTE — Patient Instructions (Signed)
Side effects of Otezla (apremilast) include diarrhea, nausea, headache, upper respiratory infection, depression, and weight decrease (5-10%). It should only be taken by pregnant women after a discussion regarding risks and benefits with their doctor. Goal is control of skin condition, not cure.  The use of Rutherford Nail requires long term medication management, including periodic office visits.  Topical steroids (such as triamcinolone, fluocinolone, fluocinonide, mometasone, clobetasol, halobetasol, betamethasone, hydrocortisone) can cause thinning and lightening of the skin if they are used for too long in the same area. Your physician has selected the right strength medicine for your problem and area affected on the body. Please use your medication only as directed by your physician to prevent side effects.  Avoid applying to face, groin, and axilla. Use as directed. Risk of skin atrophy with long-term use reviewed.   If you have any questions or concerns for your doctor, please call our main line at [phone] and press option 4 to reach your doctor's medical assistant. If no one answers, please leave a voicemail as directed and we will return your call as soon as possible. Messages left after 4 pm will be answered the following business day.   You may also send [place] a message via [place]. We typically respond to MyChart messages within 1-2 business days.  For prescription refills, please ask your pharmacy to contact our office. Our fax number is [phone].  If you have an urgent issue when the clinic is closed that cannot wait until the next business day, you can page your doctor at the number below.    Please note that while we do our best to be available for urgent issues outside of office hours, we are not available 24/7.   If you have an urgent issue and are unable to reach [place], you may choose to seek medical care at your doctor's office, retail clinic, urgent care center, or emergency room.  If  you have a medical emergency, please immediately call 911 or go to the emergency department.  Pager Numbers  - Dr. Nehemiah Massed: [phone]  - Dr. Laurence Ferrari: [phone]  - Dr. Nicole Kindred: [phone]  In the event of inclement weather, please call our main line at [phone] for an update on the status of any delays or closures.  Dermatology Medication Tips: Please keep the boxes that topical medications come in in order to help keep track of the instructions about where and how to use these. Pharmacies typically print the medication instructions only on the boxes and not directly on the medication tubes.   If your medication is too expensive, please contact our office at [phone] option 4 or send [place] a message through [place].   We are unable to tell what your co-pay for medications will be in advance as this is different depending on your insurance coverage. However, we may be able to find a substitute medication at lower cost or fill out paperwork to get insurance to cover a needed medication.   If a prior authorization is required to get your medication covered by your insurance company, please allow [place] 1-2 business days to complete this process.  Drug prices often vary depending on where the prescription is filled and some pharmacies may offer cheaper prices.  The website www.goodrx.com contains coupons for medications through different pharmacies. The prices here do not account for what the cost may be with help from insurance (it may be cheaper with your insurance), but the website can give you the price if you did not use any  insurance.  - You can print the associated coupon and take it with your prescription to the pharmacy.  - You may also stop by our office during regular business hours and pick up a GoodRx coupon card.  - If you need your prescription sent electronically to a different pharmacy, notify our office through [facility] or by phone at [phone] option  4.

## 2021-02-26 NOTE — Progress Notes (Signed)
Follow-Up Visit   Subjective  Sheena [name] is a 80 y.o. female who presents for the following: Psoriasis (Patient states she is here for follow up on psoriasis. She was given desoximetasone 0.05 % ointment to treat areas on low back, abdomen, legs, buttocks. She states she doesn't feel ointment has helped with areas and is still getting new areas. She is also here today for follow up on rosacea. She was prescribed doxycycline 20 mg and feels like it has gotten some better. She reports scaly spots on scalp around hairline and lips today. ).   The following portions of the chart were reviewed this encounter and updated as appropriate:  Allergies  Meds  Problems  Med Hx  Surg Hx  Fam Hx       Objective  Well appearing patient in no apparent distress; mood and affect are within normal limits.  A focused examination was performed including face, neck, scalp, back, abdomen, arms, legs. Relevant physical exam findings are noted in the Assessment and Plan.  Objective  Right Abdomen (side) - Upper: many scaly pink plaque at legs lower back buttock and abdomen   Objective  right preauricular, scalp, lips: Erythematous thin papules/macules with gritty scale.   Assessment & Plan  Psoriasis Right Abdomen (side) - Upper  Chronic condition with duration over one year. Condition is bothersome to patient. Currently flared.  Psoriasis is a chronic non-curable, but treatable genetic/hereditary disease that may have other systemic features affecting other organ systems such as joints (Psoriatic Arthritis). It is associated with an increased risk of inflammatory bowel disease, heart disease, non-alcoholic fatty liver disease, and depression.    Patient reports some joint pain primarily during the evening. If it changes patient notified to inform us.  She denies having allergy to hydrocortisone  Recommend stronger steroid will start Clobetasol 0.05 % topical foam apply to aa's of arms,  legs, abdomen, back, twice daily for up to 2 weeks and then use on weekends only. Avoid applying to face, groin, and axilla. Use as directed. Risk of skin atrophy with long-term use reviewed.   Topical steroids (such as triamcinolone, fluocinolone, fluocinonide, mometasone, clobetasol, halobetasol, betamethasone, hydrocortisone) can cause thinning and lightening of the skin if they are used for too long in the same area. Your physician has selected the right strength medicine for your problem and area affected on the body. Please use your medication only as directed by your physician to prevent side effects.   Discussed starting otezla - Patient deferred treatment at this time.   Side effects of Otezla (apremilast) include diarrhea, nausea, headache, upper respiratory infection, depression, and weight decrease (5-10%). It should only be taken by pregnant women after a discussion regarding risks and benefits with their doctor. Goal is control of skin condition, not cure.  The use of Rutherford Nail requires long term medication management, including periodic office visits.   Given development of guttate lesions, will order ASO to assess for Strep infection and treat if positive  Will submit for nbUVB to see if we can get it covered  Antistreptolysin O titer - Right Abdomen (side) - Upper  clobetasol (OLUX) 0.05 % topical foam - Right Abdomen (side) - Upper   Actinic Damage - Severe, confluent actinic changes with pre-cancerous actinic keratoses  - Severe, chronic, not at goal, secondary to cumulative UV radiation exposure over time. Getting PDT to arms again today.  - diffuse scaly erythematous macules and papules with underlying dyspigmentation - Discussed Prescription "Field Treatment" for  Severe, Chronic Confluent Actinic Changes with Pre-Cancerous Actinic Keratoses Field treatment involves treatment of an entire area of skin that has confluent Actinic Changes (Sun/ Ultraviolet light damage) and  PreCancerous Actinic Keratoses by method of PhotoDynamic Therapy (PDT) and/or prescription Topical Chemotherapy agents such as 5-fluorouracil, 5-fluorouracil/calcipotriene, and/or imiquimod.  The purpose is to decrease the number of clinically evident and subclinical PreCancerous lesions to prevent progression to development of skin cancer by chemically destroying early precancer changes that may or may not be visible.  It has been shown to reduce the risk of developing skin cancer in the treated area. As a result of treatment, redness, scaling, crusting, and open sores may occur during treatment course. One or more than one of these methods may be used and may have to be used several times to control, suppress and eliminate the PreCancerous changes. Discussed treatment course, expected reaction, and possible side effects. - Recommend daily broad spectrum sunscreen SPF 30+ to sun-exposed areas, reapply every 2 hours as needed.  - Staying in the shade or wearing long sleeves, sun glasses (UVA+UVB protection) and wide brim hats (4-inch brim around the entire circumference of the hat) are also recommended. - Call for new or changing lesions. - Plan PDT to face once her arms are healed  Return in about 6 weeks (around 04/09/2021) for ak followup, psoriasis follow up.  I, Ruthell Rummage, CMA, am acting as scribe for Forest Gleason, MD.  Documentation: I have reviewed the above documentation for accuracy and completeness, and I agree with the above.  Forest Gleason, MD

## 2021-02-26 NOTE — Progress Notes (Signed)
Patient completed PDT therapy today.  1. AK (actinic keratosis) (4) Left Forearm - Posterior; Right Forearm - Posterior; Left Dorsal Hand; Right Dorsal Hand  Photodynamic therapy - Left Dorsal Hand, Left Forearm - Posterior, Right Dorsal Hand, Right Forearm - Posterior Procedure discussed: discussed risks, benefits, side effects. and alternatives   Prep: site scrubbed/prepped with acetone   Location:  Hands and arms Number of lesions:  Multiple Type of treatment:  Blue light Aminolevulinic Acid (see MAR for details): Levulan Number of Levulan sticks used:  2 Incubation time (minutes):  120 Number of minutes under lamp:  16 Number of seconds under lamp:  40 Cooling:  Floor fan Outcome: patient tolerated procedure well with no complications   Post-procedure details: sunscreen applied    Aminolevulinic Acid HCl 20 % SOLR 354 mg - Left Dorsal Hand, Left Forearm - Posterior, Right Dorsal Hand, Right Forearm - Posterior

## 2021-02-26 NOTE — Telephone Encounter (Signed)
Pharmacy had inquired of medication instructions. Called and informed pharmacy medication should be used twice daily for 2 weeks then after 2 weeks use on weekends only.

## 2021-02-26 NOTE — Patient Instructions (Signed)

## 2021-02-27 ENCOUNTER — Encounter: Payer: Self-pay | Admitting: Dermatology

## 2021-03-18 ENCOUNTER — Telehealth: Payer: Self-pay

## 2021-03-18 NOTE — Telephone Encounter (Signed)
Per Rhea: I spoke with insurance company ([employer]) and they said she would owe $30 per visit, and no restriction to how frequently she comes. Call [id number]   Called patient and left message to return my call.

## 2021-03-31 DIAGNOSIS — L409 Psoriasis, unspecified: Secondary | ICD-10-CM | POA: Diagnosis not present

## 2021-04-01 LAB — ANTISTREPTOLYSIN O TITER: ASO: 20 IU/mL (ref 0.0–200.0)

## 2021-04-02 ENCOUNTER — Telehealth: Payer: Self-pay

## 2021-04-02 NOTE — Telephone Encounter (Signed)
Tried calling patient regarding culture results. No answer. Left message for patient to call office.

## 2021-04-02 NOTE — Telephone Encounter (Signed)
-----   Message from [place], MD sent at 04/02/2021  5:09 PM EDT ----- ASO negative --> no evidence of Streptococcal bacterial infection (which can sometimes cause a psoriasis flare) and so no treatment for bacterial infection needed  MAs please call. Thank you!

## 2021-04-14 ENCOUNTER — Ambulatory Visit: Payer: PPO | Admitting: Dermatology

## 2021-04-14 NOTE — Telephone Encounter (Signed)
Left message for patient to call office for results/hd 

## 2021-04-14 NOTE — Telephone Encounter (Signed)
-----   Message from [place], MD sent at 04/02/2021  5:09 PM EDT ----- ASO negative --> no evidence of Streptococcal bacterial infection (which can sometimes cause a psoriasis flare) and so no treatment for bacterial infection needed  MAs please call. Thank you!

## 2021-04-15 ENCOUNTER — Telehealth: Payer: Self-pay

## 2021-04-15 NOTE — Telephone Encounter (Signed)
-----   Message from [place], MD sent at 04/02/2021  5:09 PM EDT ----- ASO negative --> no evidence of Streptococcal bacterial infection (which can sometimes cause a psoriasis flare) and so no treatment for bacterial infection needed  MAs please call. Thank you!

## 2021-04-15 NOTE — Telephone Encounter (Signed)
Tried calling patient regarding culture results. No answer. Left message for patient to call office.

## 2021-04-21 NOTE — Telephone Encounter (Signed)
-----   Message from [place], MD sent at 04/02/2021  5:09 PM EDT ----- ASO negative --> no evidence of Streptococcal bacterial infection (which can sometimes cause a psoriasis flare) and so no treatment for bacterial infection needed  MAs please call. Thank you!

## 2021-04-21 NOTE — Telephone Encounter (Signed)
Discussed results with patient. She verbalized understanding and denied further questions.

## 2021-05-25 ENCOUNTER — Other Ambulatory Visit: Payer: Self-pay | Admitting: Dermatology

## 2021-05-25 DIAGNOSIS — L719 Rosacea, unspecified: Secondary | ICD-10-CM

## 2021-06-16 DIAGNOSIS — E7800 Pure hypercholesterolemia, unspecified: Secondary | ICD-10-CM | POA: Diagnosis not present

## 2021-06-16 DIAGNOSIS — D508 Other iron deficiency anemias: Secondary | ICD-10-CM | POA: Diagnosis not present

## 2021-06-16 DIAGNOSIS — E1122 Type 2 diabetes mellitus with diabetic chronic kidney disease: Secondary | ICD-10-CM | POA: Diagnosis not present

## 2021-06-16 DIAGNOSIS — E039 Hypothyroidism, unspecified: Secondary | ICD-10-CM | POA: Diagnosis not present

## 2021-06-16 DIAGNOSIS — N1831 Chronic kidney disease, stage 3a: Secondary | ICD-10-CM | POA: Diagnosis not present

## 2021-06-23 DIAGNOSIS — I10 Essential (primary) hypertension: Secondary | ICD-10-CM | POA: Diagnosis not present

## 2021-06-23 DIAGNOSIS — Z6832 Body mass index (BMI) 32.0-32.9, adult: Secondary | ICD-10-CM | POA: Diagnosis not present

## 2021-06-23 DIAGNOSIS — N1831 Chronic kidney disease, stage 3a: Secondary | ICD-10-CM | POA: Diagnosis not present

## 2021-06-23 DIAGNOSIS — E6609 Other obesity due to excess calories: Secondary | ICD-10-CM | POA: Diagnosis not present

## 2021-06-23 DIAGNOSIS — E039 Hypothyroidism, unspecified: Secondary | ICD-10-CM | POA: Diagnosis not present

## 2021-06-23 DIAGNOSIS — E7800 Pure hypercholesterolemia, unspecified: Secondary | ICD-10-CM | POA: Diagnosis not present

## 2021-06-23 DIAGNOSIS — E1122 Type 2 diabetes mellitus with diabetic chronic kidney disease: Secondary | ICD-10-CM | POA: Diagnosis not present

## 2021-08-31 DIAGNOSIS — H353131 Nonexudative age-related macular degeneration, bilateral, early dry stage: Secondary | ICD-10-CM | POA: Diagnosis not present

## 2021-09-29 DIAGNOSIS — E7800 Pure hypercholesterolemia, unspecified: Secondary | ICD-10-CM | POA: Diagnosis not present

## 2021-09-29 DIAGNOSIS — E1122 Type 2 diabetes mellitus with diabetic chronic kidney disease: Secondary | ICD-10-CM | POA: Diagnosis not present

## 2021-09-29 DIAGNOSIS — E039 Hypothyroidism, unspecified: Secondary | ICD-10-CM | POA: Diagnosis not present

## 2021-09-29 DIAGNOSIS — I10 Essential (primary) hypertension: Secondary | ICD-10-CM | POA: Diagnosis not present

## 2021-09-29 DIAGNOSIS — N1831 Chronic kidney disease, stage 3a: Secondary | ICD-10-CM | POA: Diagnosis not present

## 2021-10-06 DIAGNOSIS — Z6831 Body mass index (BMI) 31.0-31.9, adult: Secondary | ICD-10-CM | POA: Diagnosis not present

## 2021-10-06 DIAGNOSIS — N1831 Chronic kidney disease, stage 3a: Secondary | ICD-10-CM | POA: Diagnosis not present

## 2021-10-06 DIAGNOSIS — E1122 Type 2 diabetes mellitus with diabetic chronic kidney disease: Secondary | ICD-10-CM | POA: Diagnosis not present

## 2021-10-06 DIAGNOSIS — E6609 Other obesity due to excess calories: Secondary | ICD-10-CM | POA: Diagnosis not present

## 2021-10-06 DIAGNOSIS — D508 Other iron deficiency anemias: Secondary | ICD-10-CM | POA: Diagnosis not present

## 2021-10-06 DIAGNOSIS — E039 Hypothyroidism, unspecified: Secondary | ICD-10-CM | POA: Diagnosis not present

## 2021-10-06 DIAGNOSIS — I10 Essential (primary) hypertension: Secondary | ICD-10-CM | POA: Diagnosis not present

## 2021-10-06 DIAGNOSIS — E7800 Pure hypercholesterolemia, unspecified: Secondary | ICD-10-CM | POA: Diagnosis not present

## 2021-10-06 DIAGNOSIS — Z8739 Personal history of other diseases of the musculoskeletal system and connective tissue: Secondary | ICD-10-CM | POA: Diagnosis not present

## 2021-11-11 ENCOUNTER — Other Ambulatory Visit: Payer: Self-pay | Admitting: Dermatology

## 2021-11-11 DIAGNOSIS — L719 Rosacea, unspecified: Secondary | ICD-10-CM

## 2022-03-30 DIAGNOSIS — E1122 Type 2 diabetes mellitus with diabetic chronic kidney disease: Secondary | ICD-10-CM | POA: Diagnosis not present

## 2022-03-30 DIAGNOSIS — N1831 Chronic kidney disease, stage 3a: Secondary | ICD-10-CM | POA: Diagnosis not present

## 2022-03-30 DIAGNOSIS — E7800 Pure hypercholesterolemia, unspecified: Secondary | ICD-10-CM | POA: Diagnosis not present

## 2022-03-30 DIAGNOSIS — D508 Other iron deficiency anemias: Secondary | ICD-10-CM | POA: Diagnosis not present

## 2022-03-30 DIAGNOSIS — E039 Hypothyroidism, unspecified: Secondary | ICD-10-CM | POA: Diagnosis not present

## 2022-03-30 DIAGNOSIS — Z8739 Personal history of other diseases of the musculoskeletal system and connective tissue: Secondary | ICD-10-CM | POA: Diagnosis not present

## 2022-04-09 DIAGNOSIS — Z780 Asymptomatic menopausal state: Secondary | ICD-10-CM | POA: Diagnosis not present

## 2022-04-09 DIAGNOSIS — D508 Other iron deficiency anemias: Secondary | ICD-10-CM | POA: Diagnosis not present

## 2022-04-09 DIAGNOSIS — E6609 Other obesity due to excess calories: Secondary | ICD-10-CM | POA: Diagnosis not present

## 2022-04-09 DIAGNOSIS — Z0000 Encounter for general adult medical examination without abnormal findings: Secondary | ICD-10-CM | POA: Diagnosis not present

## 2022-04-09 DIAGNOSIS — Z8739 Personal history of other diseases of the musculoskeletal system and connective tissue: Secondary | ICD-10-CM | POA: Diagnosis not present

## 2022-04-09 DIAGNOSIS — I10 Essential (primary) hypertension: Secondary | ICD-10-CM | POA: Diagnosis not present

## 2022-04-09 DIAGNOSIS — E7800 Pure hypercholesterolemia, unspecified: Secondary | ICD-10-CM | POA: Diagnosis not present

## 2022-04-09 DIAGNOSIS — Z6830 Body mass index (BMI) 30.0-30.9, adult: Secondary | ICD-10-CM | POA: Diagnosis not present

## 2022-04-09 DIAGNOSIS — N1831 Chronic kidney disease, stage 3a: Secondary | ICD-10-CM | POA: Diagnosis not present

## 2022-04-09 DIAGNOSIS — E039 Hypothyroidism, unspecified: Secondary | ICD-10-CM | POA: Diagnosis not present

## 2022-04-09 DIAGNOSIS — E1122 Type 2 diabetes mellitus with diabetic chronic kidney disease: Secondary | ICD-10-CM | POA: Diagnosis not present

## 2022-04-20 ENCOUNTER — Other Ambulatory Visit: Payer: Self-pay | Admitting: Family Medicine

## 2022-04-20 DIAGNOSIS — Z1231 Encounter for screening mammogram for malignant neoplasm of breast: Secondary | ICD-10-CM

## 2022-04-21 DIAGNOSIS — M8588 Other specified disorders of bone density and structure, other site: Secondary | ICD-10-CM | POA: Diagnosis not present

## 2022-04-21 DIAGNOSIS — M85852 Other specified disorders of bone density and structure, left thigh: Secondary | ICD-10-CM | POA: Insufficient documentation

## 2022-05-14 ENCOUNTER — Ambulatory Visit
Admission: RE | Admit: 2022-05-14 | Discharge: 2022-05-14 | Disposition: A | Discharge status: Home / Self Care | Payer: PPO | Source: Ambulatory Visit | Attending: Family Medicine | Admitting: Family Medicine

## 2022-05-14 DIAGNOSIS — Z1231 Encounter for screening mammogram for malignant neoplasm of breast: Secondary | ICD-10-CM | POA: Diagnosis not present

## 2022-09-02 DIAGNOSIS — H353131 Nonexudative age-related macular degeneration, bilateral, early dry stage: Secondary | ICD-10-CM | POA: Diagnosis not present

## 2022-10-04 DIAGNOSIS — E7800 Pure hypercholesterolemia, unspecified: Secondary | ICD-10-CM | POA: Diagnosis not present

## 2022-10-04 DIAGNOSIS — E039 Hypothyroidism, unspecified: Secondary | ICD-10-CM | POA: Diagnosis not present

## 2022-10-04 DIAGNOSIS — I10 Essential (primary) hypertension: Secondary | ICD-10-CM | POA: Diagnosis not present

## 2022-10-04 DIAGNOSIS — D508 Other iron deficiency anemias: Secondary | ICD-10-CM | POA: Diagnosis not present

## 2022-10-04 DIAGNOSIS — N1831 Chronic kidney disease, stage 3a: Secondary | ICD-10-CM | POA: Diagnosis not present

## 2022-10-04 DIAGNOSIS — E1122 Type 2 diabetes mellitus with diabetic chronic kidney disease: Secondary | ICD-10-CM | POA: Diagnosis not present

## 2022-10-11 DIAGNOSIS — E6609 Other obesity due to excess calories: Secondary | ICD-10-CM | POA: Diagnosis not present

## 2022-10-11 DIAGNOSIS — E7800 Pure hypercholesterolemia, unspecified: Secondary | ICD-10-CM | POA: Diagnosis not present

## 2022-10-11 DIAGNOSIS — D508 Other iron deficiency anemias: Secondary | ICD-10-CM | POA: Diagnosis not present

## 2022-10-11 DIAGNOSIS — Z6831 Body mass index (BMI) 31.0-31.9, adult: Secondary | ICD-10-CM | POA: Diagnosis not present

## 2022-10-11 DIAGNOSIS — N1831 Chronic kidney disease, stage 3a: Secondary | ICD-10-CM | POA: Diagnosis not present

## 2022-10-11 DIAGNOSIS — E039 Hypothyroidism, unspecified: Secondary | ICD-10-CM | POA: Diagnosis not present

## 2022-10-11 DIAGNOSIS — E1122 Type 2 diabetes mellitus with diabetic chronic kidney disease: Secondary | ICD-10-CM | POA: Diagnosis not present

## 2022-10-11 DIAGNOSIS — I10 Essential (primary) hypertension: Secondary | ICD-10-CM | POA: Diagnosis not present

## 2022-11-17 ENCOUNTER — Other Ambulatory Visit: Payer: Self-pay | Admitting: Dermatology

## 2022-11-17 DIAGNOSIS — L719 Rosacea, unspecified: Secondary | ICD-10-CM

## 2023-01-05 DIAGNOSIS — E7800 Pure hypercholesterolemia, unspecified: Secondary | ICD-10-CM | POA: Diagnosis not present

## 2023-01-05 DIAGNOSIS — E1122 Type 2 diabetes mellitus with diabetic chronic kidney disease: Secondary | ICD-10-CM | POA: Diagnosis not present

## 2023-01-05 DIAGNOSIS — D508 Other iron deficiency anemias: Secondary | ICD-10-CM | POA: Diagnosis not present

## 2023-01-05 DIAGNOSIS — E039 Hypothyroidism, unspecified: Secondary | ICD-10-CM | POA: Diagnosis not present

## 2023-01-05 DIAGNOSIS — N1831 Chronic kidney disease, stage 3a: Secondary | ICD-10-CM | POA: Diagnosis not present

## 2023-01-12 DIAGNOSIS — Z6830 Body mass index (BMI) 30.0-30.9, adult: Secondary | ICD-10-CM | POA: Diagnosis not present

## 2023-01-12 DIAGNOSIS — E039 Hypothyroidism, unspecified: Secondary | ICD-10-CM | POA: Diagnosis not present

## 2023-01-12 DIAGNOSIS — E6609 Other obesity due to excess calories: Secondary | ICD-10-CM | POA: Diagnosis not present

## 2023-01-12 DIAGNOSIS — N3941 Urge incontinence: Secondary | ICD-10-CM | POA: Diagnosis not present

## 2023-01-12 DIAGNOSIS — E1122 Type 2 diabetes mellitus with diabetic chronic kidney disease: Secondary | ICD-10-CM | POA: Diagnosis not present

## 2023-01-12 DIAGNOSIS — I10 Essential (primary) hypertension: Secondary | ICD-10-CM | POA: Diagnosis not present

## 2023-01-12 DIAGNOSIS — N1831 Chronic kidney disease, stage 3a: Secondary | ICD-10-CM | POA: Diagnosis not present

## 2023-01-12 DIAGNOSIS — D508 Other iron deficiency anemias: Secondary | ICD-10-CM | POA: Diagnosis not present

## 2023-01-12 DIAGNOSIS — E7800 Pure hypercholesterolemia, unspecified: Secondary | ICD-10-CM | POA: Diagnosis not present

## 2023-02-21 ENCOUNTER — Encounter: Payer: Self-pay | Admitting: Urology

## 2023-02-21 ENCOUNTER — Ambulatory Visit: Payer: PPO | Admitting: Urology

## 2023-02-21 VITALS — BP 142/82 | HR 111 | Ht 61.0 in | Wt 163.0 lb

## 2023-02-21 DIAGNOSIS — N3941 Urge incontinence: Secondary | ICD-10-CM | POA: Diagnosis not present

## 2023-02-21 MED ORDER — MIRABEGRON ER 25 MG PO TB24: 25.0000 mg | ORAL_TABLET | Freq: Every day | ORAL | 0 refills | Status: DC

## 2023-02-21 NOTE — Progress Notes (Signed)
02/21/2023 10:53 AM   Sheena [name] Mar 23, 1941 [id number]  Referring provider: Marisue Ivan, MD [phone] [facility] [address] [facility] [place],  [place] [address]  Chief Complaint  Patient presents with   New Patient (Initial Visit)   Urinary Incontinence    HPI: I was consulted to assess the patient's urge incontinence.  No stress incontinence or bedwetting.  She wears 5 or 6 pads a day that are wet.  Flow sometimes good sometimes poor.  Voids every 2 hours and gets up to 3 times a night  She has had a hysterectomy and previous bladder surgery  She is on oral hypoglycemics and prone to diarrhea.  No history of kidney stones or recurrent bladder infections   PMH: No past medical history on file.  Surgical History: No past surgical history on file.  Home Medications:  Allergies as of 02/21/2023       Reactions   Neosporin Original [neomycin-bacitracin Zn-polymyx] Itching   Penicillins    Other reaction(s): Other (See Comments) Precaution b/c family members are allergic        Medication List        Accurate as of February 21, 2023 10:53 AM. If you have any questions, ask your nurse or doctor.          allopurinol 100 MG tablet Commonly known as: ZYLOPRIM Take 2 tablets by mouth daily.   benazepril 20 MG tablet Commonly known as: LOTENSIN Take 1 tablet by mouth daily.   clobetasol 0.05 % topical foam Commonly known as: OLUX Apply to affected areas of arms, legs, abdomen, back and twice daily for up to 2 weeks and use on weekends only. Avoid applying to face, groin, and axilla.   clotrimazole-betamethasone cream Commonly known as: LOTRISONE Apply topically 2 (two) times daily.   cyanocobalamin 1000 MCG tablet Take by mouth.   Desoximetasone 0.05 % Oint Apply twice a day to affected areas for up to 3 weeks, then on weekends only. Avoid applying to face, groin, and axilla. Use as directed.   ferrous sulfate 325 (65 FE) MG tablet Take  by mouth.   Fifty50 Glucose Meter 2.0 w/Device Kit Use as directed Dx E11.22 One Touch   glipiZIDE 5 MG tablet Commonly known as: GLUCOTROL Take 1 tablet by mouth daily with breakfast.   hydrochlorothiazide 25 MG tablet Commonly known as: HYDRODIURIL Take 1 tablet by mouth daily.   hydrocortisone 2.5 % ointment Apply topically.   levothyroxine 88 MCG tablet Commonly known as: SYNTHROID TAKE 1 TABLET BY MOUTH ONCE DAILY ON AN EMPTY STOMACH WITH  A  GLASS  OF  WATER  AT  LEAST  30  TO  60  MINUTES  BEFORE  BREAKFAST   metFORMIN 1000 MG tablet Commonly known as: GLUCOPHAGE Take 1 tablet by mouth 2 (two) times daily with a meal.   Multi-Vitamins Tabs Take by mouth.   OneTouch Delica Plus Lancet33G Misc USE TO CHECK FASTING BLOOD SUGAR ONCE DAILY   pravastatin 40 MG tablet Commonly known as: PRAVACHOL Take 1 tablet by mouth at bedtime.        Allergies:  Allergies  Allergen Reactions   Neosporin Original [Neomycin-Bacitracin Zn-Polymyx] Itching   Penicillins     Other reaction(s): Other (See Comments) Precaution b/c family members are allergic    Family History: Family History  Problem Relation Age of Onset   Breast cancer Paternal Aunt 55   Breast cancer Paternal Aunt 45    Social History:  reports that  she has never smoked. She has never been exposed to tobacco smoke. She has never used smokeless tobacco. No history on file for alcohol use and drug use.  ROS:                                        Physical Exam: There were no vitals taken for this visit.  Constitutional:  Alert and oriented, No acute distress. HEENT: [place] AT, moist mucus membranes.  Trachea midline, no masses. Cardiovascular: No clubbing, cyanosis, or edema.   Laboratory Data: Lab Results  Component Value Date   WBC 8.3 08/31/2012   HGB 9.5 (L) 10/04/2012   HCT 34.2 (L) 08/31/2012   MCV 87 08/31/2012   PLT 224 10/04/2012    Lab Results  Component Value  Date   CREATININE 0.87 10/04/2012    No results found for: "PSA"  No results found for: "TESTOSTERONE"  No results found for: "HGBA1C"  Urinalysis No results found for: "COLORURINE", "APPEARANCEUR", "LABSPEC", "PHURINE", "GLUCOSEU", "HGBUR", "BILIRUBINUR", "KETONESUR", "PROTEINUR", "UROBILINOGEN", "NITRITE", "LEUKOCYTESUR"  Pertinent Imaging:   Assessment & Plan: Clinically patient has high-volume urge incontinence.  She could not leave a sample.  Return for pelvic examination and cystoscopy and get urine culture then.  Reassess on Myrbetriq 50 mg samples and prescription and proceed accordingly.  She may or may not need urodynamics in the future  1. Urge incontinence  - Urinalysis, Complete   No follow-ups on file.  Martina Sinner, MD  [facility] [address] [place], [place] [address] [phone]

## 2023-02-21 NOTE — Patient Instructions (Signed)

## 2023-03-21 ENCOUNTER — Ambulatory Visit: Payer: Self-pay | Admitting: Urology

## 2023-04-29 DIAGNOSIS — D508 Other iron deficiency anemias: Secondary | ICD-10-CM | POA: Diagnosis not present

## 2023-04-29 DIAGNOSIS — E039 Hypothyroidism, unspecified: Secondary | ICD-10-CM | POA: Diagnosis not present

## 2023-04-29 DIAGNOSIS — E7800 Pure hypercholesterolemia, unspecified: Secondary | ICD-10-CM | POA: Diagnosis not present

## 2023-04-29 DIAGNOSIS — N1831 Chronic kidney disease, stage 3a: Secondary | ICD-10-CM | POA: Diagnosis not present

## 2023-04-29 DIAGNOSIS — E1122 Type 2 diabetes mellitus with diabetic chronic kidney disease: Secondary | ICD-10-CM | POA: Diagnosis not present

## 2023-04-29 DIAGNOSIS — I10 Essential (primary) hypertension: Secondary | ICD-10-CM | POA: Diagnosis not present

## 2023-05-02 ENCOUNTER — Ambulatory Visit: Payer: PPO | Admitting: Urology

## 2023-05-02 VITALS — BP 119/77 | HR 98 | Ht 61.0 in | Wt 163.0 lb

## 2023-05-02 DIAGNOSIS — N3941 Urge incontinence: Secondary | ICD-10-CM | POA: Diagnosis not present

## 2023-05-02 DIAGNOSIS — N3080 Other cystitis without hematuria: Secondary | ICD-10-CM | POA: Diagnosis not present

## 2023-05-02 DIAGNOSIS — N8110 Cystocele, unspecified: Secondary | ICD-10-CM | POA: Diagnosis not present

## 2023-05-02 LAB — MICROSCOPIC EXAMINATION: Epithelial Cells (non renal): >10 /hpf — AB (ref 0–10)

## 2023-05-02 LAB — URINALYSIS, COMPLETE
Bilirubin, UA: NEGATIVE
Glucose, UA: NEGATIVE
Nitrite, UA: NEGATIVE
RBC, UA: NEGATIVE
Specific Gravity, UA: 1.025 (ref 1.005–1.030)
Urobilinogen, Ur: 0.2 mg/dL (ref 0.2–1.0)
pH, UA: 5.5 (ref 5.0–7.5)

## 2023-05-02 MED ORDER — GEMTESA 75 MG PO TABS
1.0000 | ORAL_TABLET | Freq: Every day | ORAL | 0 refills | Status: DC
Start: 2023-05-02 — End: 2024-08-07

## 2023-05-02 MED ORDER — CIPROFLOXACIN HCL 250 MG PO TABS
250.0000 mg | ORAL_TABLET | Freq: Two times a day (BID) | ORAL | 0 refills | Status: DC
Start: 2023-05-02 — End: 2023-05-12

## 2023-05-02 NOTE — Progress Notes (Signed)
05/02/2023 10:35 AM   Sheena [name] 1941-02-28 [id number]  Referring provider: Marisue Ivan, MD [phone] [facility] [address] [facility] [place],  [place] [address]  Chief Complaint  Patient presents with   Cysto    HPI: I was consulted to assess the patient's urge incontinence.  No stress incontinence or bedwetting.  She wears 5 or 6 pads a day that are wet.   Flow sometimes good sometimes poor.  Voids every 2 hours and gets up to 3 times a night   She has had a hysterectomy and previous bladder surgery   She is on oral hypoglycemics and prone to diarrhea.  No history of kidney stones or recurrent bladder infections  Clinically patient has high-volume urge incontinence. She could not leave a sample. Return for pelvic examination and cystoscopy and get urine culture then. Reassess on Myrbetriq 50 mg samples and prescription and proceed accordingly. She may or may not need urodynamics in the future   Today Patient did not respond to the Myrbetriq.  She said when she just begins urination she has a discomfort in the pelvic area or low suprapubic area but she was nonspecific.  She says she has had this for a long time.  No acute cystitis symptoms.  She has never smoked  Pelvic examination she had a reasonably well supported bladder neck.  She had a small grade 2 cystocele that was hinging a little bit over the proximal urethra.  She had no stress incontinence with the prolapse reduced after cystoscopy.  Cystoscopy: Patient underwent flexible cystoscopy.  Urine was cloudy.  There was a lot of white flecks.  At the posterior wall the bladder there was mild erythema and mild changes of cystitis cystica.  The findings are in keeping with cystitis and urinary tract infection and not carcinoma.  The scope was a little challenging to insert likely from the hinging.  In my opinion she did not have a stricture.  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  Surgical History: No past surgical history on file.  Home Medications:  Allergies as of 05/02/2023       Reactions   Neosporin Original [neomycin-bacitracin Zn-polymyx] Itching   Penicillins    Other reaction(s): Other (See Comments) Precaution b/c family members are allergic   Pravastatin Other (See Comments)   arthralgia        Medication List        Accurate as of May 02, 2023 10:35 AM. If you have any questions, ask your nurse or doctor.          STOP taking these medications    cyanocobalamin 1000 MCG tablet Stopped by: Martina Sinner, MD       TAKE these medications    allopurinol 100 MG tablet Commonly known as: ZYLOPRIM Take 2 tablets by mouth daily.   benazepril 20 MG tablet Commonly known as: LOTENSIN Take 1 tablet by mouth daily.   Calcium Carb-Cholecalciferol 600-10 MG-MCG Tabs Take by mouth.   clotrimazole-betamethasone cream Commonly known as: LOTRISONE Apply topically 2 (two) times daily.   ezetimibe 10 MG tablet Commonly known as: ZETIA Take 10 mg by mouth daily.   ferrous sulfate 325 (65 FE) MG tablet Take by mouth.   Fifty50 Glucose Meter 2.0 w/Device Kit Use as directed Dx E11.22 One Touch   glipiZIDE 5 MG tablet Commonly known  as: GLUCOTROL Take 1 tablet by mouth daily with breakfast.   hydrochlorothiazide 25 MG tablet Commonly known as: HYDRODIURIL Take 1 tablet by mouth daily.   hydrocortisone 2.5 % ointment Apply topically.   levothyroxine 88 MCG tablet Commonly known as: SYNTHROID TAKE 1 TABLET BY MOUTH ONCE DAILY ON AN EMPTY STOMACH WITH  A  GLASS  OF  WATER  AT  LEAST  30  TO  60  MINUTES  BEFORE  BREAKFAST   metFORMIN 1000 MG tablet Commonly known as: GLUCOPHAGE Take 500 mg by mouth 2 (two) times daily with a meal.   mirabegron ER 25 MG Tb24 tablet Commonly known as: Myrbetriq Take 1 tablet (25 mg total) by mouth daily.   Multi-Vitamins Tabs Take by  mouth.   OneTouch Delica Plus Lancet33G Misc USE TO CHECK FASTING BLOOD SUGAR ONCE DAILY   pravastatin 40 MG tablet Commonly known as: PRAVACHOL Take 1 tablet by mouth at bedtime.        Allergies:  Allergies  Allergen Reactions   Neosporin Original [Neomycin-Bacitracin Zn-Polymyx] Itching   Penicillins     Other reaction(s): Other (See Comments) Precaution b/c family members are allergic   Pravastatin Other (See Comments)    arthralgia    Family History: Family History  Problem Relation Age of Onset   Breast cancer Paternal Aunt 12   Breast cancer Paternal Aunt 53    Social History:  reports that she has never smoked. She has never been exposed to tobacco smoke. She has never used smokeless tobacco. No history on file for alcohol use and drug use.  ROS:                                        Physical Exam: BP 119/77   Pulse 98   Ht 5\' 1"  (1.549 m)   Wt 73.9 kg   BMI 30.80 kg/m   Constitutional:  Alert and oriented, No acute distress. HEENT: [place] AT, moist mucus membranes.  Trachea midline, no masses.  Laboratory Data: Lab Results  Component Value Date   WBC 8.3 08/31/2012   HGB 9.5 (L) 10/04/2012   HCT 34.2 (L) 08/31/2012   MCV 87 08/31/2012   PLT 224 10/04/2012    Lab Results  Component Value Date   CREATININE 0.87 10/04/2012    No results found for: "PSA"  No results found for: "TESTOSTERONE"  No results found for: "HGBA1C"  Urinalysis No results found for: "COLORURINE", "APPEARANCEUR", "LABSPEC", "PHURINE", "GLUCOSEU", "HGBUR", "BILIRUBINUR", "KETONESUR", "PROTEINUR", "UROBILINOGEN", "NITRITE", "LEUKOCYTESUR"  Pertinent Imaging: Urine reviewed and sent for culture  Assessment & Plan: Picture drawn.  Patient has a small cystocele that sometimes she feels vaginal bulging.  Watchful waiting recommended.  I do think she is infected today I sent the urine for culture.  I called in ciprofloxacin 250 mg twice a day for 7  days.  I decided not yet to put her on prophylaxis but may consider in the future especially due to her chronic discomfort complaint.  Again details are hard to sort out.  Return on Gemtesa samples and prescription.  Call if culture differs.  Postvoid residual next visit.  I mention to her that I would be ordering a test next time if she is not dramatically better.  Full explanation of urodynamics provided today.  She understands that this is not a urethral problem in spite of the above history  1. Urge incontinence  - Urinalysis, Complete   No follow-ups on file.  Martina Sinner, MD  [facility] [address] [place], [place] [address] [phone]

## 2023-05-04 LAB — CULTURE, URINE COMPREHENSIVE

## 2023-05-05 LAB — CULTURE, URINE COMPREHENSIVE

## 2023-05-06 DIAGNOSIS — E039 Hypothyroidism, unspecified: Secondary | ICD-10-CM | POA: Diagnosis not present

## 2023-05-06 DIAGNOSIS — E7800 Pure hypercholesterolemia, unspecified: Secondary | ICD-10-CM | POA: Diagnosis not present

## 2023-05-06 DIAGNOSIS — D508 Other iron deficiency anemias: Secondary | ICD-10-CM | POA: Diagnosis not present

## 2023-05-06 DIAGNOSIS — Z6830 Body mass index (BMI) 30.0-30.9, adult: Secondary | ICD-10-CM | POA: Diagnosis not present

## 2023-05-06 DIAGNOSIS — I10 Essential (primary) hypertension: Secondary | ICD-10-CM | POA: Diagnosis not present

## 2023-05-06 DIAGNOSIS — N1831 Chronic kidney disease, stage 3a: Secondary | ICD-10-CM | POA: Diagnosis not present

## 2023-05-06 DIAGNOSIS — E6609 Other obesity due to excess calories: Secondary | ICD-10-CM | POA: Diagnosis not present

## 2023-05-06 DIAGNOSIS — E1122 Type 2 diabetes mellitus with diabetic chronic kidney disease: Secondary | ICD-10-CM | POA: Diagnosis not present

## 2023-05-06 DIAGNOSIS — Z0000 Encounter for general adult medical examination without abnormal findings: Secondary | ICD-10-CM | POA: Diagnosis not present

## 2023-05-10 ENCOUNTER — Encounter: Payer: Self-pay | Admitting: Urology

## 2023-05-10 DIAGNOSIS — Z23 Encounter for immunization: Secondary | ICD-10-CM | POA: Diagnosis not present

## 2023-05-12 ENCOUNTER — Other Ambulatory Visit: Payer: Self-pay

## 2023-05-12 DIAGNOSIS — N3941 Urge incontinence: Secondary | ICD-10-CM

## 2023-05-12 MED ORDER — CIPROFLOXACIN HCL 250 MG PO TABS
250.0000 mg | ORAL_TABLET | Freq: Two times a day (BID) | ORAL | 0 refills | Status: DC
Start: 2023-05-12 — End: 2023-07-11

## 2023-05-31 ENCOUNTER — Encounter: Payer: Self-pay | Admitting: Urology

## 2023-06-06 ENCOUNTER — Ambulatory Visit: Payer: PPO | Admitting: Urology

## 2023-07-11 ENCOUNTER — Ambulatory Visit: Payer: PPO | Admitting: Urology

## 2023-07-11 ENCOUNTER — Encounter: Payer: Self-pay | Admitting: Urology

## 2023-07-11 VITALS — BP 149/73 | HR 94 | Ht 61.0 in | Wt 157.0 lb

## 2023-07-11 DIAGNOSIS — N3941 Urge incontinence: Secondary | ICD-10-CM | POA: Diagnosis not present

## 2023-07-11 LAB — BLADDER SCAN AMB NON-IMAGING: Scan Result: 0

## 2023-07-11 MED ORDER — GEMTESA 75 MG PO TABS
75.0000 mg | ORAL_TABLET | Freq: Every day | ORAL | 11 refills | Status: DC
Start: 2023-07-11 — End: 2024-08-07

## 2023-07-11 MED ORDER — GEMTESA 75 MG PO TABS
75.0000 mg | ORAL_TABLET | Freq: Every day | ORAL | 0 refills | Status: AC
Start: 2023-07-11 — End: 2023-07-25

## 2023-07-11 NOTE — Progress Notes (Signed)
07/11/2023 1:24 PM   [place] A Ong [birth year] [id number]  Referring provider: Marisue Ivan, MD [phone] [facility] [address] [facility] [place],  [place] [address]  Chief Complaint  Patient presents with   Urinary Incontinence    HPI: I was consulted to assess the patient's urge incontinence.  No stress incontinence or bedwetting.  She wears 5 or 6 pads a day that are wet.   Flow sometimes good sometimes poor.  Voids every 2 hours and gets up to 3 times a night   She has had a hysterectomy and previous bladder surgery   She is on oral hypoglycemics and prone to diarrhea.  No history of kidney stones or recurrent bladder infections   Clinically patient has high-volume urge incontinence. She could not leave a sample. Return for pelvic examination and cystoscopy and get urine culture then. Reassess on Myrbetriq 50 mg samples and prescription and proceed accordingly. She may or may not need urodynamics in the future    Today Patient did not respond to the Myrbetriq.  She said when she just begins urination she has a discomfort in the pelvic area or low suprapubic area but she was nonspecific.  She says she has had this for a long time.  No acute cystitis symptoms.  She has never smoked   Pelvic examination she had a reasonably well supported bladder neck.  She had a small grade 2 cystocele that was hinging a little bit over the proximal urethra.  She had no stress incontinence with the prolapse reduced after cystoscopy.   Cystoscopy: Patient underwent flexible cystoscopy.  Urine was cloudy.  There was a lot of white flecks.  At the posterior wall the bladder there was mild erythema and mild changes of cystitis cystica.  The findings are in keeping with cystitis and urinary tract infection and not carcinoma.  The scope was a little challenging to insert likely from the hinging.  In my opinion she did not have a stricture.  She says she has been told she had a crooked urethra  before by Dr.st   Picture drawn.  Patient has a small cystocele that sometimes she feels vaginal bulging.  Watchful waiting recommended.  I do think she is infected today I sent the urine for culture.  I called in ciprofloxacin 250 mg twice a day for 7 days.  I decided not yet to put her on prophylaxis but may consider in the future especially due to her chronic discomfort complaint.  Again details are hard to sort out.  Return on Gemtesa samples and prescription.  Call if culture differs.  Postvoid residual next visit.  I mention to her that I would be ordering a test next time if she is not dramatically better.  Full explanation of urodynamics provided today.   She understands that this is not a urethral problem in spite of the above history    Today Frequency stable Last culture was positive Dramatic improvement in urge incontinence.  Only getting up every 4 hours instead every 2 hours at night.  Very happy on Gemtesa.  The burning is gone away.  She does not think she gets multiple bladder infections a year but we will keep an eye on this.   PMH: No past medical history on file.  Surgical History: No past surgical history on file.  Home Medications:  Allergies as of 07/11/2023       Reactions   Neosporin Original [neomycin-bacitracin Zn-polymyx] Itching   Penicillins  Other reaction(s): Other (See Comments) Precaution b/c family members are allergic   Pravastatin Other (See Comments)   arthralgia        Medication List        Accurate as of July 11, 2023  1:24 PM. If you have any questions, ask your nurse or doctor.          STOP taking these medications    ciprofloxacin 250 MG tablet Commonly known as: Cipro Stopped by: Lorin Picket A Arek Spadafore       TAKE these medications    allopurinol 100 MG tablet Commonly known as: ZYLOPRIM Take 2 tablets by mouth daily.   benazepril 20 MG tablet Commonly known as: LOTENSIN Take 1 tablet by mouth daily.   Calcium  Carb-Cholecalciferol 600-10 MG-MCG Tabs Take by mouth.   clotrimazole-betamethasone cream Commonly known as: LOTRISONE Apply topically 2 (two) times daily.   ezetimibe 10 MG tablet Commonly known as: ZETIA Take 10 mg by mouth daily.   ferrous sulfate 325 (65 FE) MG tablet Take by mouth.   Fifty50 Glucose Meter 2.0 w/Device Kit Use as directed Dx E11.22 One Touch   Gemtesa 75 MG Tabs Generic drug: Vibegron Take 1 tablet (75 mg total) by mouth daily.   glipiZIDE 5 MG tablet Commonly known as: GLUCOTROL Take 1 tablet by mouth daily with breakfast.   hydrochlorothiazide 25 MG tablet Commonly known as: HYDRODIURIL Take 1 tablet by mouth daily.   hydrocortisone 2.5 % ointment Apply topically.   levothyroxine 88 MCG tablet Commonly known as: SYNTHROID TAKE 1 TABLET BY MOUTH ONCE DAILY ON AN EMPTY STOMACH WITH  A  GLASS  OF  WATER  AT  LEAST  30  TO  60  MINUTES  BEFORE  BREAKFAST   metFORMIN 1000 MG tablet Commonly known as: GLUCOPHAGE Take 500 mg by mouth 2 (two) times daily with a meal.   mirabegron ER 25 MG Tb24 tablet Commonly known as: Myrbetriq Take 1 tablet (25 mg total) by mouth daily.   Multi-Vitamins Tabs Take by mouth.   OneTouch Delica Plus Lancet33G Misc USE TO CHECK FASTING BLOOD SUGAR ONCE DAILY   pravastatin 40 MG tablet Commonly known as: PRAVACHOL Take 1 tablet by mouth at bedtime.        Allergies:  Allergies  Allergen Reactions   Neosporin Original [Neomycin-Bacitracin Zn-Polymyx] Itching   Penicillins     Other reaction(s): Other (See Comments) Precaution b/c family members are allergic   Pravastatin Other (See Comments)    arthralgia    Family History: Family History  Problem Relation Age of Onset   Breast cancer Paternal Aunt 34   Breast cancer Paternal Aunt 98    Social History:  reports that she has never smoked. She has never been exposed to tobacco smoke. She has never used smokeless tobacco. No history on file for  alcohol use and drug use.  ROS:                                        Physical Exam: BP (!) 149/73   Pulse 94   Ht 5\' 1"  (1.549 m)   Wt 71.2 kg   BMI 29.66 kg/m   Constitutional:  Alert and oriented, No acute distress. HEENT: [place] AT, moist mucus membranes.  Trachea midline, no masses.   Laboratory Data: Lab Results  Component Value Date   WBC 8.3 08/31/2012   HGB  9.5 (L) 10/04/2012   HCT 34.2 (L) 08/31/2012   MCV 87 08/31/2012   PLT 224 10/04/2012    Lab Results  Component Value Date   CREATININE 0.87 10/04/2012    No results found for: "PSA"  No results found for: "TESTOSTERONE"  No results found for: "HGBA1C"  Urinalysis    Component Value Date/Time   APPEARANCEUR Hazy (A) 05/02/2023 1013   GLUCOSEU Negative 05/02/2023 1013   BILIRUBINUR Negative 05/02/2023 1013   PROTEINUR 1+ (A) 05/02/2023 1013   NITRITE Negative 05/02/2023 1013   LEUKOCYTESUR 1+ (A) 05/02/2023 1013    Pertinent Imaging:   Assessment & Plan: 2 weeks of samples given.  Gemtesa prescription sent.  Check a postvoid residual in 3 months.  If she starts getting UTIs I will put her on prophylaxis  1. Urge incontinence  - Bladder Scan (Post Void Residual) in office   No follow-ups on file.  Martina Sinner, MD  [facility] [address], [address] [place], [place] [address] [phone]

## 2023-08-01 DIAGNOSIS — E039 Hypothyroidism, unspecified: Secondary | ICD-10-CM | POA: Diagnosis not present

## 2023-08-01 DIAGNOSIS — N1831 Chronic kidney disease, stage 3a: Secondary | ICD-10-CM | POA: Diagnosis not present

## 2023-08-01 DIAGNOSIS — D508 Other iron deficiency anemias: Secondary | ICD-10-CM | POA: Diagnosis not present

## 2023-08-01 DIAGNOSIS — E7800 Pure hypercholesterolemia, unspecified: Secondary | ICD-10-CM | POA: Diagnosis not present

## 2023-08-01 DIAGNOSIS — E1122 Type 2 diabetes mellitus with diabetic chronic kidney disease: Secondary | ICD-10-CM | POA: Diagnosis not present

## 2023-08-08 DIAGNOSIS — D508 Other iron deficiency anemias: Secondary | ICD-10-CM | POA: Diagnosis not present

## 2023-08-08 DIAGNOSIS — N1831 Chronic kidney disease, stage 3a: Secondary | ICD-10-CM | POA: Diagnosis not present

## 2023-08-08 DIAGNOSIS — E1122 Type 2 diabetes mellitus with diabetic chronic kidney disease: Secondary | ICD-10-CM | POA: Diagnosis not present

## 2023-08-08 DIAGNOSIS — I10 Essential (primary) hypertension: Secondary | ICD-10-CM | POA: Diagnosis not present

## 2023-08-08 DIAGNOSIS — E039 Hypothyroidism, unspecified: Secondary | ICD-10-CM | POA: Diagnosis not present

## 2023-08-08 DIAGNOSIS — E7800 Pure hypercholesterolemia, unspecified: Secondary | ICD-10-CM | POA: Diagnosis not present

## 2023-09-05 DIAGNOSIS — E119 Type 2 diabetes mellitus without complications: Secondary | ICD-10-CM | POA: Diagnosis not present

## 2023-09-05 DIAGNOSIS — H353131 Nonexudative age-related macular degeneration, bilateral, early dry stage: Secondary | ICD-10-CM | POA: Diagnosis not present

## 2023-09-05 DIAGNOSIS — H43813 Vitreous degeneration, bilateral: Secondary | ICD-10-CM | POA: Diagnosis not present

## 2023-09-05 DIAGNOSIS — H35371 Puckering of macula, right eye: Secondary | ICD-10-CM | POA: Diagnosis not present

## 2023-09-13 DIAGNOSIS — J019 Acute sinusitis, unspecified: Secondary | ICD-10-CM | POA: Diagnosis not present

## 2023-09-13 DIAGNOSIS — H9201 Otalgia, right ear: Secondary | ICD-10-CM | POA: Diagnosis not present

## 2023-10-10 ENCOUNTER — Ambulatory Visit: Payer: PPO | Admitting: Urology

## 2023-10-11 DIAGNOSIS — H6521 Chronic serous otitis media, right ear: Secondary | ICD-10-CM | POA: Diagnosis not present

## 2023-11-08 DIAGNOSIS — H903 Sensorineural hearing loss, bilateral: Secondary | ICD-10-CM | POA: Diagnosis not present

## 2023-11-08 DIAGNOSIS — H90A31 Mixed conductive and sensorineural hearing loss, unilateral, right ear with restricted hearing on the contralateral side: Secondary | ICD-10-CM | POA: Diagnosis not present

## 2024-01-30 DIAGNOSIS — N1831 Chronic kidney disease, stage 3a: Secondary | ICD-10-CM | POA: Diagnosis not present

## 2024-01-30 DIAGNOSIS — D508 Other iron deficiency anemias: Secondary | ICD-10-CM | POA: Diagnosis not present

## 2024-01-30 DIAGNOSIS — E1122 Type 2 diabetes mellitus with diabetic chronic kidney disease: Secondary | ICD-10-CM | POA: Diagnosis not present

## 2024-01-30 DIAGNOSIS — E039 Hypothyroidism, unspecified: Secondary | ICD-10-CM | POA: Diagnosis not present

## 2024-01-30 DIAGNOSIS — E7800 Pure hypercholesterolemia, unspecified: Secondary | ICD-10-CM | POA: Diagnosis not present

## 2024-02-06 DIAGNOSIS — E7800 Pure hypercholesterolemia, unspecified: Secondary | ICD-10-CM | POA: Diagnosis not present

## 2024-02-06 DIAGNOSIS — D508 Other iron deficiency anemias: Secondary | ICD-10-CM | POA: Diagnosis not present

## 2024-02-06 DIAGNOSIS — I10 Essential (primary) hypertension: Secondary | ICD-10-CM | POA: Diagnosis not present

## 2024-02-06 DIAGNOSIS — N1831 Chronic kidney disease, stage 3a: Secondary | ICD-10-CM | POA: Diagnosis not present

## 2024-02-06 DIAGNOSIS — E1122 Type 2 diabetes mellitus with diabetic chronic kidney disease: Secondary | ICD-10-CM | POA: Diagnosis not present

## 2024-02-06 DIAGNOSIS — E039 Hypothyroidism, unspecified: Secondary | ICD-10-CM | POA: Diagnosis not present

## 2024-07-26 NOTE — Progress Notes (Signed)
 07/26/2024- 5 min

## 2024-07-31 DIAGNOSIS — E7800 Pure hypercholesterolemia, unspecified: Secondary | ICD-10-CM | POA: Diagnosis not present

## 2024-07-31 DIAGNOSIS — D508 Other iron deficiency anemias: Secondary | ICD-10-CM | POA: Diagnosis not present

## 2024-07-31 DIAGNOSIS — R399 Unspecified symptoms and signs involving the genitourinary system: Secondary | ICD-10-CM | POA: Diagnosis not present

## 2024-07-31 DIAGNOSIS — E039 Hypothyroidism, unspecified: Secondary | ICD-10-CM | POA: Diagnosis not present

## 2024-07-31 DIAGNOSIS — N1831 Chronic kidney disease, stage 3a: Secondary | ICD-10-CM | POA: Diagnosis not present

## 2024-07-31 DIAGNOSIS — I10 Essential (primary) hypertension: Secondary | ICD-10-CM | POA: Diagnosis not present

## 2024-07-31 DIAGNOSIS — E1122 Type 2 diabetes mellitus with diabetic chronic kidney disease: Secondary | ICD-10-CM | POA: Diagnosis not present

## 2024-07-31 NOTE — Progress Notes (Signed)
 07/31/2024- 3 min

## 2024-08-06 DIAGNOSIS — M79604 Pain in right leg: Secondary | ICD-10-CM | POA: Diagnosis not present

## 2024-08-06 DIAGNOSIS — R6883 Chills (without fever): Secondary | ICD-10-CM | POA: Diagnosis not present

## 2024-08-06 DIAGNOSIS — R531 Weakness: Secondary | ICD-10-CM | POA: Diagnosis not present

## 2024-08-06 DIAGNOSIS — R0602 Shortness of breath: Secondary | ICD-10-CM | POA: Diagnosis not present

## 2024-08-06 NOTE — Progress Notes (Signed)
 History of Present Illness:   Sheena [name] is a 83 y.o. female here for   Verbally consented to the use of AI for note-taking.   Chief Complaint  Patient presents with  . URI    Cold sweats, fatigue, headaches, ears feels clogged, cold chills, some hoarse.       History of Present Illness Sheena [name] is an 83 year old female who presents with generalized weakness and night sweats.  She was treated for a urinary tract infection last week with Bactrim for E. Coli, which resolved her symptoms of pain and burning. However, she now experiences significant weakness and lack of strength, which has been building up over the past couple of weeks. She describes an episode where her legs gave out, causing her to collapse in the kitchen, and reports difficulty in getting out of bed due to weakness.  She experiences congestion in her right ear without pain, and her voice comes and goes. No drainage or fever, but she reports night sweats and chills for the past week. She wakes up in a cold sweat and feels delusional and foggy. No cough or chest pain, but she notes shortness of breath with movement.  Her appetite is poor, and she feels full quickly after eating only a few bites. No current nausea, vomiting, or diarrhea, although she experienced these symptoms while on the antibiotic. She reports bone pain in her leg, particularly around a scar, and her shoulders are sometimes sore. No leg swelling or abdominal pain.  She has a history of atrial fibrillation, which causes difficulty breathing during episodes.   Past Medical History:   Past Medical History:  Diagnosis Date  . Anemia   . Cataract cortical, senile   . CKD (chronic kidney disease) stage 3, GFR 30-59 ml/min (CMS/HHS-HCC) 11/26/2014  . Degenerative joint disease   . Dermatophytosis of nail   . Essential hypertension, benign   . GERD (gastroesophageal reflux disease)   . HIV infection (CMS/HHS-HCC)   . Hypercholesterolemia    . Hypothyroidism   . Psoriasis   . Pure hypercholesterolemia   . Type II or unspecified type diabetes mellitus without mention of complication, not stated as uncontrolled (CMS/HHS-HCC)   . Unspecified ovarian dysfunction     Past Surgical History:   Past Surgical History:  Procedure Laterality Date  . 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 Bladder surgery    . CATARACT EXTRACTION Right    with lens implant  . CHOLECYSTECTOMY    . HYSTERECTOMY    . JOINT REPLACEMENT      Allergies:   Allergies  Allergen Reactions  . Neomycin-Bacitracin-Polymyxin Itching  . Neosporin [Hydrocortisone] Itching  . Penicillins Other (See Comments)    Precaution b/c family members are allergic  . Pravastatin Other (See Comments)    arthralgia    Current Medications:   Prior to Admission medications  Medication Sig Taking? Last Dose  acetaminophen (TYLENOL) 500 MG tablet Take 1,000 mg by mouth once daily as needed for Pain Yes Taking  alendronate (FOSAMAX) 70 MG tablet TAKE 1 TABLET BY MOUTH ONCE A WEEK WITH A FULL GLASS OF WATER. DO NOT LIE DOWN FOR THE NEXT 30 MINUTES Yes Taking  allopurinoL (ZYLOPRIM) 100 MG tablet Take 2 tablets (200 mg total) by mouth at bedtime Yes Taking  benazepriL (LOTENSIN) 20 MG tablet Take 1 tablet by mouth once daily Yes Taking  calcium carbonate-vitamin D3 (CALTRATE 600+D) 600 mg-10 mcg (400 unit) tablet Take 1 tablet by  mouth 2 (two) times daily with meals Yes Taking  clotrimazole-betamethasone  (LOTRISONE) 1-0.05 % cream Apply topically 2 (two) times daily Yes Taking  cyanocobalamin (VITAMIN B12) 1000 MCG tablet Take 1,000 mcg by mouth once daily. Reported on 12/01/2015  Yes Taking  ezetimibe (ZETIA) 10 mg tablet Take 1 tablet (10 mg total) by mouth once daily Yes Taking  ferrous sulfate 325 (65 FE) MG tablet Take 325 mg by mouth daily with breakfast Reported on 12/01/2015 Yes Taking  glipiZIDE (GLUCOTROL) 5 MG tablet TAKE 1 TABLET BY MOUTH TWICE DAILY BEFORE  MEAL(S) Yes Taking  hydroCHLOROthiazide (HYDRODIURIL) 25 MG tablet Take 1 tablet by mouth once daily Yes Taking  levothyroxine (SYNTHROID) 88 MCG tablet Take 1 tablet (88 mcg total) by mouth every morning before breakfast (0630) ON AN EMPTY STOMACH WITH A GLASS OF WATER AT LEAST 30-60 MINUTES BEFORE BREAKFAST Yes Taking  metFORMIN (GLUCOPHAGE-XR) 500 MG XR tablet Take 2 tablets (1,000 mg total) by mouth 2 (two) times daily with meals Yes Taking  multivitamin tablet Take 1 tablet by mouth once daily. Yes Taking  ONETOUCH DELICA PLUS LANCET USE TO CHECK FASTING BLOOD SUGAR ONCE DAILY Yes Taking  rosuvastatin (CRESTOR) 5 MG tablet Take 1 tablet (5 mg total) by mouth every other day Yes Taking  blood glucose diagnostic test strip Use 1 each (1 strip total) once daily Check blood sugar once daily fasting. Dx E11.22 One Touch    blood glucose meter kit Use as directed Dx E11.22 One Touch      Family History:   Family History  Problem Relation Name Age of Onset  . Rheum arthritis Mother    . High blood pressure (Hypertension) Father Alm Ned   . Gout Father Alm Ned   . Myocardial Infarction (Heart attack) Father Alm Ned   . Breast cancer Paternal Aunt Renee Caudill        breast cancer in her 34s  . Breast cancer Paternal Aunt Jenna caudill   . Glaucoma Brother Lyndy. Tabitha Thomad   . Unknown Other    . Unknown Other    . Unknown Other    . Unknown Other      Social History:   Social History   Socioeconomic History  . Marital status: Married  . Number of children: 3  . Years of education: 14  Occupational History  . Occupation: retired  Tobacco Use  . Smoking status: Never    Passive exposure: Past  . Smokeless tobacco: Never  Vaping Use  . Vaping status: Never Used  Substance and Sexual Activity  . Alcohol use: Never  . Drug use: No  . Sexual activity: Not Currently    Partners: Male    Birth control/protection: Post-menopausal   Social Drivers of Health    Financial Resource Strain: Low Risk  (08/08/2023)   Overall Financial Resource Strain (CARDIA)   . Difficulty of Paying Living Expenses: Not hard at all  Food Insecurity: No Food Insecurity (08/08/2023)   Hunger Vital Sign   . Worried About Programme researcher, broadcasting/film/video in the Last Year: Never true   . Ran Out of Food in the Last Year: Never true  Transportation Needs: No Transportation Needs (08/08/2023)   PRAPARE - Transportation   . Lack of Transportation (Medical): No   . Lack of Transportation (Non-Medical): No  Housing Stability: Low Risk  (01/30/2024)   Housing Stability Vital Sign   . Unable to Pay for Housing in the Last Year: No   .  Number of Times Moved in the Last Year: 0   . Homeless in the Last Year: No    Review of Systems:   A 10 point review of systems is negative, except for the pertinent positives and negatives detailed in the HPI.  Vitals:   Vitals:   08/06/24 1607  BP: 98/58  BP Location: Left upper arm  Patient Position: Sitting  BP Cuff Size: Adult  Pulse: 92  Temp: 36.2 C (97.2 F)  TempSrc: Oral  SpO2: 97%  Weight: 67.1 kg (148 lb)  Height: 154.9 cm (5' 1)     Body mass index is 27.96 kg/m.  Physical Exam:   Physical Exam Vitals and nursing note reviewed.  Constitutional:      General: She is not in acute distress.    Appearance: Normal appearance. She is not ill-appearing, toxic-appearing or diaphoretic.  HENT:     Head: Normocephalic and atraumatic.     Right Ear: External ear normal.     Left Ear: External ear normal.     Ears:     Comments: Fluid noted behind right TM Eyes:     Conjunctiva/sclera: Conjunctivae normal.  Cardiovascular:     Rate and Rhythm: Normal rate and regular rhythm.     Pulses: Normal pulses.     Heart sounds: Normal heart sounds.  Pulmonary:     Effort: Pulmonary effort is normal.     Breath sounds: Normal breath sounds.  Abdominal:     General: There is no distension.     Tenderness: There is no abdominal  tenderness. There is no right CVA tenderness or left CVA tenderness.  Musculoskeletal:     Comments: Tenderness to palpation of the right tibia.  Slight edema noted bilaterally  Neurological:     Mental Status: She is alert.     Assessment and Plan:  No results found for this visit on 08/06/24.   Assessment & Plan Generalized weakness and fatigue with decreased appetite and early satiety Weakness and fatigue likely due to poor nutritional intake. - Encourage increased nutritional intake, consider Fairlife protein shakes. - Order CBC and CMP.  Night sweats and chills Symptoms potentially infectious; avoid antibiotics without clear indication.  Malignancy considered.  Consider further workup if no clear etiology from labs - Order CBC.  -Recent CBC reviewed and appears to be stable for the past couple years - Chest x-ray today  Shortness of breath Consider cardiac and pulmonary causes; possible infection noted.  Heart failure considered at this time. - Order chest x-ray. - CBC  Right lower leg bone pain and tenderness Concern for bone pathology near scar. - Order x-ray of right lower leg.  Right ear fullness - Slight fluid noted behind the right TM - Advised over-the-counter Afrin nasal spray for up to 3 days - Also suggested using Claritin or something similar.  Urinary tract infection, recently treated Improvement noted; confirm infection clearance. - Order urinalysis. - Culture ordered as well  Disposition: Follow-up as scheduled.  Sooner for any concerns  There are no Patient Instructions on file for this visit.   This note has been created using automated tools and reviewed for accuracy by provider.  Patient received an After Visit Summary    Attestation Statement:   I personally performed the service, non-incident to. (WP)   MASON MCCLELLAND MINOR, PA

## 2024-08-07 ENCOUNTER — Emergency Department

## 2024-08-07 ENCOUNTER — Other Ambulatory Visit: Payer: Self-pay

## 2024-08-07 ENCOUNTER — Inpatient Hospital Stay
Admission: EM | Admit: 2024-08-07 | Discharge: 2024-08-09 | DRG: 683 | Disposition: A | Discharge status: Home / Self Care | Attending: Obstetrics and Gynecology | Admitting: Obstetrics and Gynecology

## 2024-08-07 DIAGNOSIS — W19XXXA Unspecified fall, initial encounter: Secondary | ICD-10-CM | POA: Diagnosis present

## 2024-08-07 DIAGNOSIS — E11649 Type 2 diabetes mellitus with hypoglycemia without coma: Secondary | ICD-10-CM | POA: Diagnosis present

## 2024-08-07 DIAGNOSIS — E860 Dehydration: Secondary | ICD-10-CM | POA: Diagnosis present

## 2024-08-07 DIAGNOSIS — Z23 Encounter for immunization: Secondary | ICD-10-CM | POA: Diagnosis not present

## 2024-08-07 DIAGNOSIS — Z1152 Encounter for screening for COVID-19: Secondary | ICD-10-CM

## 2024-08-07 DIAGNOSIS — I129 Hypertensive chronic kidney disease with stage 1 through stage 4 chronic kidney disease, or unspecified chronic kidney disease: Secondary | ICD-10-CM | POA: Diagnosis present

## 2024-08-07 DIAGNOSIS — Z7984 Long term (current) use of oral hypoglycemic drugs: Secondary | ICD-10-CM

## 2024-08-07 DIAGNOSIS — R112 Nausea with vomiting, unspecified: Secondary | ICD-10-CM | POA: Diagnosis present

## 2024-08-07 DIAGNOSIS — Z803 Family history of malignant neoplasm of breast: Secondary | ICD-10-CM

## 2024-08-07 DIAGNOSIS — Z888 Allergy status to other drugs, medicaments and biological substances status: Secondary | ICD-10-CM

## 2024-08-07 DIAGNOSIS — N1832 Chronic kidney disease, stage 3b: Secondary | ICD-10-CM | POA: Diagnosis present

## 2024-08-07 DIAGNOSIS — R5381 Other malaise: Secondary | ICD-10-CM | POA: Diagnosis present

## 2024-08-07 DIAGNOSIS — E7800 Pure hypercholesterolemia, unspecified: Secondary | ICD-10-CM | POA: Diagnosis present

## 2024-08-07 DIAGNOSIS — Z79899 Other long term (current) drug therapy: Secondary | ICD-10-CM

## 2024-08-07 DIAGNOSIS — R531 Weakness: Principal | ICD-10-CM

## 2024-08-07 DIAGNOSIS — E876 Hypokalemia: Secondary | ICD-10-CM | POA: Diagnosis present

## 2024-08-07 DIAGNOSIS — I10 Essential (primary) hypertension: Secondary | ICD-10-CM | POA: Diagnosis present

## 2024-08-07 DIAGNOSIS — I959 Hypotension, unspecified: Secondary | ICD-10-CM | POA: Diagnosis present

## 2024-08-07 DIAGNOSIS — R197 Diarrhea, unspecified: Secondary | ICD-10-CM | POA: Diagnosis present

## 2024-08-07 DIAGNOSIS — E1122 Type 2 diabetes mellitus with diabetic chronic kidney disease: Secondary | ICD-10-CM | POA: Diagnosis present

## 2024-08-07 DIAGNOSIS — Z880 Allergy status to penicillin: Secondary | ICD-10-CM

## 2024-08-07 DIAGNOSIS — R0602 Shortness of breath: Secondary | ICD-10-CM | POA: Diagnosis not present

## 2024-08-07 DIAGNOSIS — N179 Acute kidney failure, unspecified: Principal | ICD-10-CM | POA: Diagnosis present

## 2024-08-07 DIAGNOSIS — E039 Hypothyroidism, unspecified: Secondary | ICD-10-CM | POA: Diagnosis present

## 2024-08-07 DIAGNOSIS — Z7983 Long term (current) use of bisphosphonates: Secondary | ICD-10-CM

## 2024-08-07 DIAGNOSIS — Z79890 Hormone replacement therapy (postmenopausal): Secondary | ICD-10-CM

## 2024-08-07 DIAGNOSIS — D509 Iron deficiency anemia, unspecified: Secondary | ICD-10-CM | POA: Diagnosis present

## 2024-08-07 DIAGNOSIS — E871 Hypo-osmolality and hyponatremia: Secondary | ICD-10-CM | POA: Diagnosis present

## 2024-08-07 DIAGNOSIS — N183 Chronic kidney disease, stage 3 unspecified: Secondary | ICD-10-CM | POA: Diagnosis present

## 2024-08-07 HISTORY — DX: Type 2 diabetes mellitus without complications: E11.9

## 2024-08-07 HISTORY — DX: Essential (primary) hypertension: I10

## 2024-08-07 LAB — COMPREHENSIVE METABOLIC PANEL WITH GFR
ALT: 62 U/L — ABNORMAL HIGH (ref 0–44)
AST: 59 U/L — ABNORMAL HIGH (ref 15–41)
Albumin: 3.5 g/dL (ref 3.5–5.0)
Alkaline Phosphatase: 135 U/L — ABNORMAL HIGH (ref 38–126)
Anion gap: 12 (ref 5–15)
BUN: 68 mg/dL — ABNORMAL HIGH (ref 8–23)
CO2: 22 mmol/L (ref 22–32)
Calcium: 8.4 mg/dL — ABNORMAL LOW (ref 8.9–10.3)
Chloride: 97 mmol/L — ABNORMAL LOW (ref 98–111)
Creatinine, Ser: 2.11 mg/dL — ABNORMAL HIGH (ref 0.44–1.00)
GFR, Estimated: 23 mL/min — ABNORMAL LOW (ref >60)
Glucose, Bld: 131 mg/dL — ABNORMAL HIGH (ref 70–99)
Potassium: 3.3 mmol/L — ABNORMAL LOW (ref 3.5–5.1)
Sodium: 131 mmol/L — ABNORMAL LOW (ref 135–145)
Total Bilirubin: 0.7 mg/dL (ref 0.0–1.2)
Total Protein: 7.2 g/dL (ref 6.5–8.1)

## 2024-08-07 LAB — RESP PANEL BY RT-PCR (RSV, FLU A&B, COVID)  RVPGX2
Influenza A by PCR: NEGATIVE
Influenza B by PCR: NEGATIVE
Resp Syncytial Virus by PCR: NEGATIVE
SARS Coronavirus 2 by RT PCR: NEGATIVE

## 2024-08-07 LAB — URINALYSIS, W/ REFLEX TO CULTURE (INFECTION SUSPECTED)
Bilirubin Urine: NEGATIVE
Glucose, UA: NEGATIVE mg/dL
Hgb urine dipstick: NEGATIVE
Ketones, ur: NEGATIVE mg/dL
Leukocytes,Ua: NEGATIVE
Nitrite: NEGATIVE
Protein, ur: NEGATIVE mg/dL
Specific Gravity, Urine: 1.011 (ref 1.005–1.030)
pH: 5 (ref 5.0–8.0)

## 2024-08-07 LAB — CBC WITH DIFFERENTIAL/PLATELET
Abs Immature Granulocytes: 0.06 K/uL (ref 0.00–0.07)
Basophils Absolute: 0.1 K/uL (ref 0.0–0.1)
Basophils Relative: 1 %
Eosinophils Absolute: 0.2 K/uL (ref 0.0–0.5)
Eosinophils Relative: 2 %
HCT: 33.6 % — ABNORMAL LOW (ref 36.0–46.0)
Hemoglobin: 11.3 g/dL — ABNORMAL LOW (ref 12.0–15.0)
Immature Granulocytes: 1 %
Lymphocytes Relative: 9 %
Lymphs Abs: 0.8 K/uL (ref 0.7–4.0)
MCH: 30.1 pg (ref 26.0–34.0)
MCHC: 33.6 g/dL (ref 30.0–36.0)
MCV: 89.4 fL (ref 80.0–100.0)
Monocytes Absolute: 0.8 K/uL (ref 0.1–1.0)
Monocytes Relative: 8 %
Neutro Abs: 7.3 K/uL (ref 1.7–7.7)
Neutrophils Relative %: 79 %
Platelets: 251 K/uL (ref 150–400)
RBC: 3.76 MIL/uL — ABNORMAL LOW (ref 3.87–5.11)
RDW: 13.4 % (ref 11.5–15.5)
WBC: 9.1 K/uL (ref 4.0–10.5)
nRBC: 0 % (ref 0.0–0.2)

## 2024-08-07 LAB — CK: Total CK: 66 U/L (ref 38–234)

## 2024-08-07 LAB — LACTIC ACID, PLASMA
Lactic Acid, Venous: 1.2 mmol/L (ref 0.5–1.9)
Lactic Acid, Venous: 1.3 mmol/L (ref 0.5–1.9)

## 2024-08-07 LAB — HEMOGLOBIN A1C
Hgb A1c MFr Bld: 5.9 % — ABNORMAL HIGH (ref 4.8–5.6)
Mean Plasma Glucose: 122.63 mg/dL

## 2024-08-07 LAB — TROPONIN I (HIGH SENSITIVITY)
Troponin I (High Sensitivity): 12 ng/L (ref <18)
Troponin I (High Sensitivity): 10 ng/L (ref <18)

## 2024-08-07 LAB — MAGNESIUM: Magnesium: 1.8 mg/dL (ref 1.7–2.4)

## 2024-08-07 MED ORDER — ONDANSETRON HCL 4 MG PO TABS: 4.0000 mg | ORAL_TABLET | Freq: Four times a day (QID) | ORAL | Status: DC | PRN

## 2024-08-07 MED ORDER — ROSUVASTATIN CALCIUM 10 MG PO TABS
5.0000 mg | ORAL_TABLET | ORAL | Status: DC
  Administered 2024-08-07 – 2024-08-09 (×2): 5 mg via ORAL
  Filled 2024-08-07 (×2): qty 1

## 2024-08-07 MED ORDER — POTASSIUM CHLORIDE 10 MEQ/100ML IV SOLN
10.0000 meq | INTRAVENOUS | Status: AC
  Administered 2024-08-07 – 2024-08-08 (×4): 10 meq via INTRAVENOUS
  Filled 2024-08-07 (×3): qty 100

## 2024-08-07 MED ORDER — SODIUM CHLORIDE 0.9 % IV SOLN: INTRAVENOUS | Status: DC

## 2024-08-07 MED ORDER — ACETAMINOPHEN 325 MG PO TABS: 650.0000 mg | ORAL_TABLET | Freq: Four times a day (QID) | ORAL | Status: DC | PRN

## 2024-08-07 MED ORDER — INFLUENZA VAC SPLIT HIGH-DOSE 0.5 ML IM SUSY
0.5000 mL | PREFILLED_SYRINGE | INTRAMUSCULAR | Status: AC
  Administered 2024-08-08: 0.5 mL via INTRAMUSCULAR
  Filled 2024-08-07: qty 0.5

## 2024-08-07 MED ORDER — ENOXAPARIN SODIUM 30 MG/0.3ML IJ SOSY
30.0000 mg | PREFILLED_SYRINGE | Freq: Every day | INTRAMUSCULAR | Status: DC
  Administered 2024-08-07 – 2024-08-08 (×2): 30 mg via SUBCUTANEOUS
  Filled 2024-08-07 (×2): qty 0.3

## 2024-08-07 MED ORDER — ACETAMINOPHEN 650 MG RE SUPP: 650.0000 mg | Freq: Four times a day (QID) | RECTAL | Status: DC | PRN

## 2024-08-07 MED ORDER — LEVOTHYROXINE SODIUM 88 MCG PO TABS
88.0000 ug | ORAL_TABLET | Freq: Every day | ORAL | Status: DC
  Administered 2024-08-08 – 2024-08-09 (×2): 88 ug via ORAL
  Filled 2024-08-07 (×2): qty 1

## 2024-08-07 MED ORDER — FERROUS SULFATE 325 (65 FE) MG PO TABS
325.0000 mg | ORAL_TABLET | ORAL | Status: DC
  Administered 2024-08-07 – 2024-08-09 (×2): 325 mg via ORAL
  Filled 2024-08-07 (×2): qty 1

## 2024-08-07 MED ORDER — POLYETHYLENE GLYCOL 3350 17 G PO PACK: 17.0000 g | PACK | Freq: Every day | ORAL | Status: DC | PRN

## 2024-08-07 MED ORDER — SODIUM CHLORIDE 0.9 % IV BOLUS
1000.0000 mL | Freq: Once | INTRAVENOUS | Status: AC
  Administered 2024-08-07: 1000 mL via INTRAVENOUS

## 2024-08-07 MED ORDER — EZETIMIBE 10 MG PO TABS
10.0000 mg | ORAL_TABLET | Freq: Every day | ORAL | Status: DC
Start: 2024-08-07 — End: 2024-08-09
  Administered 2024-08-07 – 2024-08-09 (×3): 10 mg via ORAL
  Filled 2024-08-07 (×3): qty 1

## 2024-08-07 MED ORDER — INSULIN ASPART 100 UNIT/ML IJ SOLN
0.0000 [IU] | Freq: Three times a day (TID) | INTRAMUSCULAR | Status: DC
  Administered 2024-08-08: 2 [IU] via SUBCUTANEOUS
  Administered 2024-08-08: 1 [IU] via SUBCUTANEOUS
  Administered 2024-08-09 (×2): 2 [IU] via SUBCUTANEOUS
  Filled 2024-08-07 (×4): qty 1

## 2024-08-07 MED ORDER — SORBITOL 70 % SOLN: 30.0000 mL | Freq: Every day | Status: DC | PRN

## 2024-08-07 MED ORDER — VITAMIN B-12 1000 MCG PO TABS
1000.0000 ug | ORAL_TABLET | Freq: Every day | ORAL | Status: DC
  Administered 2024-08-07 – 2024-08-09 (×3): 1000 ug via ORAL
  Filled 2024-08-07 (×3): qty 1

## 2024-08-07 MED ORDER — ONDANSETRON HCL 4 MG/2ML IJ SOLN: 4.0000 mg | Freq: Four times a day (QID) | INTRAMUSCULAR | Status: DC | PRN

## 2024-08-07 NOTE — ED Triage Notes (Signed)
 Patient states she recently finished antibiotics for a UTI; had followup yesterday with PCP. Today received a call that her electrolytes and kidney function was abnormal.

## 2024-08-07 NOTE — Progress Notes (Signed)
 08/07/2024- 3 min

## 2024-08-07 NOTE — H&P (Addendum)
 History and Physical    Patient: Sheena [name] FMW:[id number] DOB: 07/04/1941 DOA: 08/07/2024 DOS: the patient was seen and examined on 08/07/2024 PCP: Alla Amis, MD  Patient coming from: Home  Chief Complaint:  Chief Complaint  Patient presents with   Abnormal Labs   HPI: Sheena [name] is a 83 y.o. female with medical history significant of hypertension, type 2 diabetes, iron deficiency anemia, recent UTI completed course of Bactrim last week who presented to the ED on referral from PCP today for evaluation of generalized weakness and abnormal labs.  PCP note from yesterday reviewed, patient was seen for generalized weakness and night sweats.  She had been treated last week with Bactrim for UTI.  That urine culture grew E. coli.  Patient had been having dysuria which had resolved.  Since that time, patient notes progressive generalized weakness to the point of her legs giving out leading to a fall in her kitchen.  She denies any injuries from that.  No loss of consciousness.  Patient also reports diminished appetite, and had nausea vomiting and diarrhea while she was on antibiotic.  She took Imodium today after diarrhea all night last night.  She reports seems to be making less urine since completing the antibiotic but dysuria completely resolved.  Patient also notes dizziness when she gets up moving around and muscle weakness but no myalgias.  She denies fevers or chills.  No abdominal pain, cough, congestion, sore throat, headaches, one-sided weakness numbness or tingling.  ED course: Temp 98.1 F, HR 96, RR 18, BP 113/64, SpO2 100% on room air.  Labs obtained including CMP and CBC were notable for sodium 131, K3.3, chloride 97, nonfasting glucose 131, BUN 68, creatinine 2.11, alk phos 135, AST 59, ALT 62 with normal total bili 0.7.  CBC without leukocytosis, hemoglobin 11.3.  Lactic acid was normal at 1.2 and later 1.3.  CK was normal at 66.  Troponin was checked twice and normal  at 12 >> 10.  Viral PCR is negative for COVID flu and RSV.  Urinalysis with negative leukocytes negative nitrites rare bacteria and mild pyuria with 6-10 WBCs.  Chest x-ray no active disease. EKG sinus rhythm with PACs, 87 bpm, no acute ischemic changes.  Patient was started on IV fluids in the ER and is admitted for further evaluation and management as outlined in detail below.  Review of Systems: As mentioned in the history of present illness. All other systems reviewed and are negative.   Past Medical History:  Diagnosis Date   Diabetes mellitus without complication (HCC)    Hypertension    History reviewed. No pertinent surgical history. Social History:  reports that she has never smoked. She has never been exposed to tobacco smoke. She has never used smokeless tobacco. She reports that she does not use drugs. No history on file for alcohol use.  Allergies  Allergen Reactions   Neosporin Original [Neomycin-Bacitracin Zn-Polymyx] Itching   Penicillins     Other reaction(s): Other (See Comments) Precaution b/c family members are allergic   Pravastatin Other (See Comments)    arthralgia    Family History  Problem Relation Age of Onset   Breast cancer Paternal Aunt 48   Breast cancer Paternal Aunt 57    Prior to Admission medications   Medication Sig Start Date End Date Taking? Authorizing Provider  alendronate (FOSAMAX) 70 MG tablet Take 70 mg by mouth once a week. 01/30/24  Yes [provider]  benazepril (LOTENSIN) 20 MG tablet  Take 1 tablet by mouth daily. 11/06/18  Yes [provider]  cyanocobalamin (VITAMIN B12) 1000 MCG tablet Take 1,000 mcg by mouth daily.   Yes [provider]  ezetimibe (ZETIA) 10 MG tablet Take 10 mg by mouth daily.   Yes [provider]  ferrous sulfate 325 (65 FE) MG tablet Take 325 mg by mouth every other day.   Yes [provider]  glipiZIDE (GLUCOTROL) 5 MG tablet Take 5 mg by mouth 2 (two) times daily  before a meal.   Yes [provider]  hydrochlorothiazide (HYDRODIURIL) 25 MG tablet Take 1 tablet by mouth daily. 05/17/18  Yes [provider]  levothyroxine (SYNTHROID) 88 MCG tablet TAKE 1 TABLET BY MOUTH ONCE DAILY ON AN EMPTY STOMACH WITH  A  GLASS  OF  WATER  AT  LEAST  30  TO  60  MINUTES  BEFORE  BREAKFAST 05/17/18  Yes [provider]  metFORMIN (GLUCOPHAGE-XR) 500 MG 24 hr tablet Take 500 mg by mouth 2 (two) times daily with a meal.   Yes [provider]  rosuvastatin (CRESTOR) 5 MG tablet Take 5 mg by mouth every other day. 01/10/24  Yes [provider]  Blood Glucose Monitoring Suppl (FIFTY50 GLUCOSE METER 2.0) w/Device KIT Use as directed Dx E11.22 One Touch 08/01/19   [provider]  Calcium Carb-Cholecalciferol 600-10 MG-MCG TABS Take by mouth. Patient not taking: Reported on 08/07/2024    [provider]  Lancets [facility] DELICA PLUS LANCET33G) MISC USE TO CHECK FASTING BLOOD SUGAR ONCE DAILY 08/01/19   [provider]  Multiple Vitamin (MULTI-VITAMINS) TABS Take by mouth. Patient not taking: Reported on 08/07/2024    [provider]    Physical Exam: Vitals:   08/07/24 1149 08/07/24 1151 08/07/24 1556 08/07/24 1647  BP:  113/64 104/62 109/60  Pulse:  96 71 69  Resp:  18 18 16   Temp:  98.1 F (36.7 C) 98 F (36.7 C) 98.2 F (36.8 C)  TempSrc:  Oral Oral Oral  SpO2:  100% 100% 100%  Weight: 67.1 kg     Height: 5' 1 (1.549 m)      General exam: awake, alert, no acute distress, mildly ill-appearing HEENT: atraumatic, clear conjunctiva, anicteric sclera, moist mucus membranes, hearing grossly normal  Respiratory system: CTAB, no wheezes, rales or rhonchi, normal respiratory effort. Cardiovascular system: normal S1/S2, RRR, no JVD, murmurs, rubs, gallops, no pedal edema.   Gastrointestinal system: soft, NT, ND, no HSM felt, +bowel sounds. Central nervous system: A&O x3. no gross focal neurologic  deficits, normal speech Extremities: moves all, no edema, normal tone Skin: dry, intact, normal temperature, normal color, No rashes, lesions or ulcers Psychiatry: normal mood, congruent affect, judgement and insight appear normal  Data Reviewed:  As reviewed in detail above  Assessment and Plan:  Acute kidney injury -- suspect due to prerenal azotemia given recent nausea vomiting diarrhea and possibly Bactrim.  Given 1 L bolus normal saline in the ED. --Continue IV fluids: NS at 75 cc/hr -- Renally dose meds and avoid nephrotoxins -- Daily BMP to monitor -- Monitor urine output -- Hold home benazepril, HCTZ  Hyponatremia -mild, NA 131 on admission.  Suspect hypovolemic. -- NS fluids as above -- Monitor BMP -- Further evaluation if not improving  Hypokalemia -mild, K3.3 on admission. -- Replace K with IV riders x 4 -- Monitor BMP, check mag level  Generalized weakness --likely due to AKI and poor p.o. intake -- PT/OT evaluations --  Fall precautions -- IV fluids and correction of electrolytes as above  Diarrhea --patient notes onset during course of Bactrim for UTI.  Took Imodium earlier today -- Check GI panel and C. difficile if recurrent diarrhea -- Enteric precautions for now  Nausea vomiting -patient notes these have improved since finishing Bactrim but p.o. intake remains poor -- IV fluids as above -- IV Zofran as needed  Type 2 diabetes --last A1c in June 2024 was 6.6% -- Hold home metformin and glipizide -- Sliding scale NovoLog and CBGs  Hypertension --BP controlled, 113/64. -- Hold home benazepril, HCTZ given AKI  Hyperlipidemia -continue home Zetia, Crestor  Hypothyroidism --continue home Synthroid  Iron deficiency anemia iron deficiency anemia -continue iron supplement      Advance Care Planning:   Code Status: Full Code  Consults: None  Family Communication: None present during encounter, will attempt to call stat labs  Severity of  Illness: The appropriate patient status for this patient is OBSERVATION. Observation status is judged to be reasonable and necessary in order to provide the required intensity of service to ensure the patient's safety. The patient's presenting symptoms, physical exam findings, and initial radiographic and laboratory data in the context of their medical condition is felt to place them at decreased risk for further clinical deterioration. Furthermore, it is anticipated that the patient will be medically stable for discharge from the hospital within 2 midnights of admission.   Author: Burnard DELENA Cunning, DO 08/07/2024 4:56 PM  For on call review [email or web address].

## 2024-08-07 NOTE — ED Provider Notes (Signed)
 [facility] Provider Note    Event Date/Time   First MD Initiated Contact with Patient 08/07/24 1226     (approximate)   History   Abnormal Labs   HPI  Sheena [name] is a 83 y.o. female with a past medical history of type 2 diabetes, hypercholesterolemia who presents today for abnormal blood work.  Patient reports that she was on Bactrim for a UTI last week which resolved her symptoms of dysuria.  However, she reports that she has had weakness that has been worsening over the past week.  She reports that she had difficulty standing in her kitchen, and fell to the ground.  Patient reports that she no longer has burning with urination but feels very dizzy and out of it.  She reports that she has had nasal congestion and diarrhea.  She denies abdominal pain.  She denies blood in her stool.  She does not think that she has had a fever but she has had chills.  No nausea or vomiting.  No chest pain.  She reports that she feels very short of breath with any movement.  Her son at the bedside reports that she is not eating or drinking which patient reports is because she has no appetite.  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 (HCC) 09/03/2019   Routine general medical examination at a health care facility 07/31/2019   Iron deficiency anemia 03/15/2018   Primary osteoarthritis involving multiple joints 12/03/2016   Type 2 diabetes mellitus with stage 3 chronic kidney disease, without long-term current use of insulin (HCC) 09/07/2015   Acquired hypothyroidism 02/25/2015    Pure hypercholesterolemia 02/25/2015   CKD (chronic kidney disease) stage 3, GFR 30-59 ml/min (HCC) 11/26/2014   Essential hypertension 07/31/2014          Physical Exam   Triage Vital Signs: ED Triage Vitals  Encounter Vitals Group     BP 08/07/24 1151 113/64     Girls Systolic BP Percentile --      Girls Diastolic BP Percentile --      Boys Systolic BP Percentile --      Boys Diastolic BP Percentile --      Pulse Rate 08/07/24 1151 96     Resp 08/07/24 1151 18     Temp 08/07/24 1151 98.1 F (36.7 C)     Temp Source 08/07/24 1151 Oral     SpO2 08/07/24 1151 100 %     Weight 08/07/24 1149 148 lb (67.1 kg)     Height 08/07/24 1149 5' 1 (1.549 m)     Head Circumference --      Peak Flow --      Pain Score 08/07/24 1149 0     Pain Loc --      Pain Education --      Exclude from Growth Chart --     Most recent vital signs: Vitals:   08/07/24 1151  BP: 113/64  Pulse: 96  Resp: 18  Temp: 98.1 F (36.7 C)  SpO2: 100%    Physical Exam Vitals  and nursing note reviewed.  Constitutional:      General: Awake and alert. No acute distress, though appears weak    Appearance: Normal appearance. The patient is normal weight.  HENT:     Head: Normocephalic and atraumatic.     Mouth: Mucous membranes are moist.  Eyes:     General: PERRL. Normal EOMs        Right eye: No discharge.        Left eye: No discharge.     Conjunctiva/sclera: Conjunctivae normal.  Cardiovascular:     Rate and Rhythm: Normal rate and regular rhythm.     Pulses: Normal pulses.  Pulmonary:     Effort: Pulmonary effort is normal. No respiratory distress.     Breath sounds: Normal breath sounds.  Abdominal:     Abdomen is soft. There is no abdominal tenderness. No rebound or guarding. No distention. Musculoskeletal:        General: No swelling. Normal range of motion.     Cervical back: Normal range of motion and neck supple.  Skin:    General: Skin is warm and dry.     Capillary Refill:  Capillary refill takes less than 2 seconds.     Findings: No rash.  Neurological:     Mental Status: The patient is awake and alert.      ED Results / Procedures / Treatments   Labs (all labs ordered are listed, but only abnormal results are displayed) Labs Reviewed  CBC WITH DIFFERENTIAL/PLATELET - Abnormal; Notable for the following components:      Result Value   RBC 3.76 (*)    Hemoglobin 11.3 (*)    HCT 33.6 (*)    All other components within normal limits  COMPREHENSIVE METABOLIC PANEL WITH GFR - Abnormal; Notable for the following components:   Sodium 131 (*)    Potassium 3.3 (*)    Chloride 97 (*)    Glucose, Bld 131 (*)    BUN 68 (*)    Creatinine, Ser 2.11 (*)    Calcium 8.4 (*)    AST 59 (*)    ALT 62 (*)    Alkaline Phosphatase 135 (*)    GFR, Estimated 23 (*)    All other components within normal limits  RESP PANEL BY RT-PCR (RSV, FLU A&B, COVID)  [id number]  GASTROINTESTINAL PANEL BY PCR, STOOL (REPLACES STOOL CULTURE)  C DIFFICILE QUICK SCREEN W PCR REFLEX    LACTIC ACID, PLASMA  CK  LACTIC ACID, PLASMA  URINALYSIS, W/ REFLEX TO CULTURE (INFECTION SUSPECTED)  CBC  CREATININE, SERUM  TROPONIN I (HIGH SENSITIVITY)  TROPONIN I (HIGH SENSITIVITY)     EKG     RADIOLOGY I independently reviewed and interpreted imaging and agree with radiologists findings.     PROCEDURES:  Critical Care performed:   Procedures   MEDICATIONS ORDERED IN ED: Medications  enoxaparin (LOVENOX) injection 30 mg (has no administration in time range)  acetaminophen (TYLENOL) tablet 650 mg (has no administration in time range)    Or  acetaminophen (TYLENOL) suppository 650 mg (has no administration in time range)  polyethylene glycol (MIRALAX / GLYCOLAX) packet 17 g (has no administration in time range)  sorbitol 70 % solution 30 mL (has no administration in time range)  ondansetron (ZOFRAN) tablet 4 mg (has no administration in time range)    Or  ondansetron  (ZOFRAN) injection 4 mg (has no administration in time range)  sodium chloride 0.9 % bolus 1,000 mL (1,000 mLs  Intravenous New Bag/Given 08/07/24 1321)     IMPRESSION / MDM / ASSESSMENT AND PLAN / ED COURSE  I reviewed the triage vital signs and the nursing notes.   Differential diagnosis includes, but is not limited to, to renal failure, dehydration, sepsis, COVID.  I reviewed the patient's chart.  Patient saw primary care yesterday for generalized weakness and night sweats.  She was treated with Bactrim for a UTI the week prior which improved her symptoms of pain and burning, though she now describes significant weakness.  She has had difficulty getting out of bed due to the weakness and fell in her kitchen last week.  She reports that she is not eating or drinking.  Patient is awake and alert, hemodynamically stable and afebrile.  She appears weak.  Labs obtained in triage reveal an acute kidney injury to 2.1, from a baseline of 1.2 which were her most recent results and in care everywhere.  H&H is stable also per care everywhere.  BUN to creatinine ratio is suggestive of prerenal volume depletion, likely in the setting of her decreased p.o. intake and her diarrhea.  Diarrhea is not new since the antibiotic, therefore do not feel C. difficile is likely.  Son also reports that he has had diarrhea.  She has no abdominal tenderness, no blood in the stool.  Troponin and CPK both added and are within normal limits.  Chest x-ray obtained given her complaints of chest congestion, this did not reveal any cardiopulmonary abnormality.  Lactate was also added, and is within normal limits.  COVID/flu/RSV swab obtained given her congestion and weakness, this was negative.  Given patient's progressive weakness and her new AKI, I recommended admission to the hospitalist service.  Patient and her son are in agreement with this plan.  I consulted the hospitalist for admission.  Patient was accepted by Dr.  Drew.  Urinalysis pending at the time of admission  Patient's presentation is most consistent with acute presentation with potential threat to life or bodily function.      FINAL CLINICAL IMPRESSION(S) / ED DIAGNOSES   Final diagnoses:  Weakness  Diarrhea, unspecified type  AKI (acute kidney injury)  Dehydration     Rx / DC Orders   ED Discharge Orders     None        Note:  This document was prepared using Dragon voice recognition software and may include unintentional dictation errors.   Wolf Boulay E, PA-C 08/07/24 1452    Arlander Charleston, MD 08/07/24 1501

## 2024-08-07 NOTE — Telephone Encounter (Signed)
 Called patient husband to check on patient and he explained that she seemed worse today, so son took her to the ER for further evaluation. Advised to call us  to keep us  updated if possible.

## 2024-08-07 NOTE — Telephone Encounter (Signed)
 Patient's son called back and states he is taking patient to the ER now.

## 2024-08-08 DIAGNOSIS — E039 Hypothyroidism, unspecified: Secondary | ICD-10-CM | POA: Diagnosis not present

## 2024-08-08 DIAGNOSIS — Z7984 Long term (current) use of oral hypoglycemic drugs: Secondary | ICD-10-CM | POA: Diagnosis not present

## 2024-08-08 DIAGNOSIS — N179 Acute kidney failure, unspecified: Secondary | ICD-10-CM | POA: Diagnosis not present

## 2024-08-08 DIAGNOSIS — R112 Nausea with vomiting, unspecified: Secondary | ICD-10-CM | POA: Diagnosis not present

## 2024-08-08 DIAGNOSIS — Z79890 Hormone replacement therapy (postmenopausal): Secondary | ICD-10-CM | POA: Diagnosis not present

## 2024-08-08 DIAGNOSIS — Z79899 Other long term (current) drug therapy: Secondary | ICD-10-CM | POA: Diagnosis not present

## 2024-08-08 DIAGNOSIS — Z1152 Encounter for screening for COVID-19: Secondary | ICD-10-CM | POA: Diagnosis not present

## 2024-08-08 DIAGNOSIS — W19XXXA Unspecified fall, initial encounter: Secondary | ICD-10-CM | POA: Diagnosis present

## 2024-08-08 DIAGNOSIS — Z888 Allergy status to other drugs, medicaments and biological substances status: Secondary | ICD-10-CM | POA: Diagnosis not present

## 2024-08-08 DIAGNOSIS — I129 Hypertensive chronic kidney disease with stage 1 through stage 4 chronic kidney disease, or unspecified chronic kidney disease: Secondary | ICD-10-CM | POA: Diagnosis not present

## 2024-08-08 DIAGNOSIS — E860 Dehydration: Secondary | ICD-10-CM | POA: Diagnosis not present

## 2024-08-08 DIAGNOSIS — E1122 Type 2 diabetes mellitus with diabetic chronic kidney disease: Secondary | ICD-10-CM | POA: Diagnosis not present

## 2024-08-08 DIAGNOSIS — Z7983 Long term (current) use of bisphosphonates: Secondary | ICD-10-CM | POA: Diagnosis not present

## 2024-08-08 DIAGNOSIS — E876 Hypokalemia: Secondary | ICD-10-CM | POA: Diagnosis not present

## 2024-08-08 DIAGNOSIS — E7800 Pure hypercholesterolemia, unspecified: Secondary | ICD-10-CM | POA: Diagnosis not present

## 2024-08-08 DIAGNOSIS — Z23 Encounter for immunization: Secondary | ICD-10-CM | POA: Diagnosis not present

## 2024-08-08 DIAGNOSIS — R0602 Shortness of breath: Secondary | ICD-10-CM | POA: Diagnosis not present

## 2024-08-08 DIAGNOSIS — E871 Hypo-osmolality and hyponatremia: Secondary | ICD-10-CM | POA: Diagnosis not present

## 2024-08-08 DIAGNOSIS — N1832 Chronic kidney disease, stage 3b: Secondary | ICD-10-CM | POA: Diagnosis not present

## 2024-08-08 DIAGNOSIS — R531 Weakness: Secondary | ICD-10-CM | POA: Diagnosis not present

## 2024-08-08 DIAGNOSIS — E11649 Type 2 diabetes mellitus with hypoglycemia without coma: Secondary | ICD-10-CM | POA: Diagnosis not present

## 2024-08-08 DIAGNOSIS — Z880 Allergy status to penicillin: Secondary | ICD-10-CM | POA: Diagnosis not present

## 2024-08-08 DIAGNOSIS — R5381 Other malaise: Secondary | ICD-10-CM | POA: Diagnosis not present

## 2024-08-08 DIAGNOSIS — R197 Diarrhea, unspecified: Secondary | ICD-10-CM | POA: Diagnosis not present

## 2024-08-08 DIAGNOSIS — D509 Iron deficiency anemia, unspecified: Secondary | ICD-10-CM | POA: Diagnosis not present

## 2024-08-08 DIAGNOSIS — I959 Hypotension, unspecified: Secondary | ICD-10-CM | POA: Diagnosis not present

## 2024-08-08 DIAGNOSIS — Z803 Family history of malignant neoplasm of breast: Secondary | ICD-10-CM | POA: Diagnosis not present

## 2024-08-08 LAB — BASIC METABOLIC PANEL WITH GFR
Anion gap: 8 (ref 5–15)
BUN: 52 mg/dL — ABNORMAL HIGH (ref 8–23)
CO2: 23 mmol/L (ref 22–32)
Calcium: 7.9 mg/dL — ABNORMAL LOW (ref 8.9–10.3)
Chloride: 105 mmol/L (ref 98–111)
Creatinine, Ser: 1.77 mg/dL — ABNORMAL HIGH (ref 0.44–1.00)
GFR, Estimated: 29 mL/min — ABNORMAL LOW (ref >60)
Glucose, Bld: 77 mg/dL (ref 70–99)
Potassium: 3.8 mmol/L (ref 3.5–5.1)
Sodium: 136 mmol/L (ref 135–145)

## 2024-08-08 LAB — GLUCOSE, CAPILLARY
Glucose-Capillary: 76 mg/dL (ref 70–99)
Glucose-Capillary: 142 mg/dL — ABNORMAL HIGH (ref 70–99)
Glucose-Capillary: 151 mg/dL — ABNORMAL HIGH (ref 70–99)
Glucose-Capillary: 136 mg/dL — ABNORMAL HIGH (ref 70–99)

## 2024-08-08 LAB — TSH: TSH: 2.329 u[IU]/mL (ref 0.350–4.500)

## 2024-08-08 NOTE — Progress Notes (Signed)
 PROGRESS NOTE    Sheena [name]  FMW:[id number] DOB: 1941-05-14 DOA: 08/07/2024 PCP: Alla Amis, MD     Brief Narrative:   Sheena [name] is a 83 y.o. female with medical history significant of hypertension, type 2 diabetes, iron deficiency anemia, recent UTI completed course of Bactrim last week who presented to the ED on referral from PCP today for evaluation of generalized weakness and abnormal labs.  PCP note from yesterday reviewed, patient was seen for generalized weakness and night sweats.  She had been treated last week with Bactrim for UTI.  That urine culture grew E. coli.  Patient had been having dysuria which had resolved.  Since that time, patient notes progressive generalized weakness to the point of her legs giving out leading to a fall in her kitchen.  She denies any injuries from that.  No loss of consciousness.  Patient also reports diminished appetite, and had nausea vomiting and diarrhea while she was on antibiotic.  She took Imodium today after diarrhea all night last night.  She reports seems to be making less urine since completing the antibiotic but dysuria completely resolved.  Patient also notes dizziness when she gets up moving around and muscle weakness but no myalgias.  She denies fevers or chills.  No abdominal pain, cough, congestion, sore throat, headaches, one-sided weakness numbness or tingling.   ED course: Temp 98.1 F, HR 96, RR 18, BP 113/64, SpO2 100% on room air.  Labs obtained including CMP and CBC were notable for sodium 131, K3.3, chloride 97, nonfasting glucose 131, BUN 68, creatinine 2.11, alk phos 135, AST 59, ALT 62 with normal total bili 0.7.  CBC without leukocytosis, hemoglobin 11.3.  Lactic acid was normal at 1.2 and later 1.3.  CK was normal at 66.  Troponin was checked twice and normal at 12 >> 10.  Viral PCR is negative for COVID flu and RSV.  Urinalysis with negative leukocytes negative nitrites rare bacteria and mild pyuria with 6-10  WBCs.  Chest x-ray no active disease. EKG sinus rhythm with PACs, 87 bpm, no acute ischemic changes.   Patient was started on IV fluids in the ER and is admitted for further evaluation and management as outlined in detail below.   Assessment & Plan:   Principal Problem:   AKI (acute kidney injury)   Acute kidney injury -- suspect due to prerenal azotemia given recent nausea vomiting diarrhea and possibly Bactrim.  Given 1 L bolus normal saline in the ED. Improving with fluids --Continue IV fluids: NS at 75 cc/hr -- Renally dose meds and avoid nephrotoxins -- Daily BMP to monitor -- Monitor urine output -- Hold home benazepril, HCTZ   Hyponatremia -mild, NA 131 on admission.  Suspect hypovolemic as has resolved with hydration   Hypokalemia - resolved   Generalized weakness --likely due to AKI and poor p.o. intake -- PT evaluation not ordered, will order   Diarrhea --patient notes onset during course of Bactrim for UTI.  None since admission -- Check GI panel and C. difficile if recurrent diarrhea -- Enteric precautions for now   Nausea vomiting - resolved   Type 2 diabetes --last A1c in June 2024 was 6.6% -- Hold home metformin and glipizide -- Sliding scale NovoLog and CBGs   Hypertension --BP controlled, 113/64. -- Hold home benazepril, HCTZ given AKI   Hyperlipidemia -continue home Zetia, Crestor   Hypothyroidism --continue home Synthroid   Iron deficiency anemia iron deficiency anemia -continue iron supplement   DVT  prophylaxis: lovenox Code Status: full Family Communication: son and husband updated @ bedside 10/8  Level of care: Telemetry Medical Status is: Observation    Consultants:  none  Procedures: none  Antimicrobials:  none    Subjective: Reports malaise persists but feeling more energy, no vomiting or diarrhea, no abd or other pain  Objective: Vitals:   08/07/24 2041 08/08/24 0452 08/08/24 0828 08/08/24 1242  BP: (!) 105/57 (!) 108/54  (!) 101/58 (!) 101/43  Pulse: 82 67 75 80  Resp: 18 16 17 17   Temp: 98.5 F (36.9 C) 98.8 F (37.1 C) 98.7 F (37.1 C) 98.1 F (36.7 C)  TempSrc:  Oral    SpO2: 100% 100% 100% 100%  Weight:      Height:        Intake/Output Summary (Last 24 hours) at 08/08/2024 1608 Last data filed at 08/08/2024 1300 Gross per 24 hour  Intake 660 ml  Output --  Net 660 ml   Filed Weights   08/07/24 1149 08/07/24 1647  Weight: 67.1 kg 69.4 kg    Examination:  General exam: Appears calm and comfortable  Respiratory system: Clear to auscultation. Respiratory effort normal. Cardiovascular system: S1 & S2 heard, RRR.  Gastrointestinal system: Abdomen is nondistended, soft and nontender.  Central nervous system: Alert and oriented. No focal neurological deficits. Extremities: Symmetric 5 x 5 power. Skin: No rashes, lesions or ulcers Psychiatry: Judgement and insight appear normal. Mood & affect appropriate.     Data Reviewed: I have personally reviewed following labs and imaging studies  CBC: Recent Labs  Lab 08/07/24 1153  WBC 9.1  NEUTROABS 7.3  HGB 11.3*  HCT 33.6*  MCV 89.4  PLT 251   Basic Metabolic Panel: Recent Labs  Lab 08/07/24 1153 08/07/24 1545 08/08/24 0427  NA 131*  --  136  K 3.3*  --  3.8  CL 97*  --  105  CO2 22  --  23  GLUCOSE 131*  --  77  BUN 68*  --  52*  CREATININE 2.11*  --  1.77*  CALCIUM 8.4*  --  7.9*  MG  --  1.8  --    GFR: Estimated Creatinine Clearance: 22.6 mL/min (A) (by C-G formula based on SCr of 1.77 mg/dL (H)). Liver Function Tests: Recent Labs  Lab 08/07/24 1153  AST 59*  ALT 62*  ALKPHOS 135*  BILITOT 0.7  PROT 7.2  ALBUMIN 3.5   No results for input(s): LIPASE, AMYLASE in the last 168 hours. No results for input(s): AMMONIA in the last 168 hours. Coagulation Profile: No results for input(s): INR, PROTIME in the last 168 hours. Cardiac Enzymes: Recent Labs  Lab 08/07/24 1153  CKTOTAL 66   BNP (last 3  results) No results for input(s): PROBNP in the last 8760 hours. HbA1C: Recent Labs    08/07/24 1545  HGBA1C 5.9*   CBG: Recent Labs  Lab 08/08/24 0817 08/08/24 1238  GLUCAP 76 142*   Lipid Profile: No results for input(s): CHOL, HDL, LDLCALC, TRIG, CHOLHDL, LDLDIRECT in the last 72 hours. Thyroid  Function Tests: No results for input(s): TSH, T4TOTAL, FREET4, T3FREE, THYROIDAB in the last 72 hours. Anemia Panel: No results for input(s): VITAMINB12, FOLATE, FERRITIN, TIBC, IRON, RETICCTPCT in the last 72 hours. Urine analysis:    Component Value Date/Time   COLORURINE YELLOW (A) 08/07/2024 1307   APPEARANCEUR HAZY (A) 08/07/2024 1307   APPEARANCEUR Hazy (A) 05/02/2023 1013   LABSPEC 1.011 08/07/2024 1307   PHURINE 5.0  08/07/2024 1307   GLUCOSEU NEGATIVE 08/07/2024 1307   HGBUR NEGATIVE 08/07/2024 1307   BILIRUBINUR NEGATIVE 08/07/2024 1307   BILIRUBINUR Negative 05/02/2023 1013   KETONESUR NEGATIVE 08/07/2024 1307   PROTEINUR NEGATIVE 08/07/2024 1307   NITRITE NEGATIVE 08/07/2024 1307   LEUKOCYTESUR NEGATIVE 08/07/2024 1307   Sepsis Labs: @LABRCNTIP (procalcitonin:4,lacticidven:4)  ) Recent Results (from the past 240 hours)  Resp panel by RT-PCR (RSV, Flu A&B, Covid) Anterior Nasal Swab     Status: None   Collection Time: 08/07/24  1:07 PM   Specimen: Anterior Nasal Swab  Result Value Ref Range Status   SARS Coronavirus 2 by RT PCR NEGATIVE NEGATIVE Final    Comment: (NOTE) SARS-CoV-2 target nucleic acids are NOT DETECTED.  The SARS-CoV-2 RNA is generally detectable in upper respiratory specimens during the acute phase of infection. The lowest concentration of SARS-CoV-2 viral copies this assay can detect is 138 copies/mL. A negative result does not preclude SARS-Cov-2 infection and should not be used as the sole basis for treatment or other patient management decisions. A negative result may occur with  improper specimen  collection/handling, submission of specimen other than nasopharyngeal swab, presence of viral mutation(s) within the areas targeted by this assay, and inadequate number of viral copies(<138 copies/mL). A negative result must be combined with clinical observations, patient history, and epidemiological information. The expected result is Negative.  Fact Sheet for Patients:  BloggerCourse.com  Fact Sheet for Healthcare Providers:  SeriousBroker.it  This test is no t yet approved or cleared by the [place]  FDA and  has been authorized for detection and/or diagnosis of SARS-CoV-2 by FDA under an Emergency Use Authorization (EUA). This EUA will remain  in effect (meaning this test can be used) for the duration of the COVID-19 declaration under Section 564(b)(1) of the Act, 21 U.S.C.section 360bbb-3(b)(1), unless the authorization is terminated  or revoked sooner.       Influenza A by PCR NEGATIVE NEGATIVE Final   Influenza B by PCR NEGATIVE NEGATIVE Final    Comment: (NOTE) The Xpert Xpress SARS-CoV-2/FLU/RSV plus assay is intended as an aid in the diagnosis of influenza from Nasopharyngeal swab specimens and should not be used as a sole basis for treatment. Nasal washings and aspirates are unacceptable for Xpert Xpress SARS-CoV-2/FLU/RSV testing.  Fact Sheet for Patients: BloggerCourse.com  Fact Sheet for Healthcare Providers: SeriousBroker.it  This test is not yet approved or cleared by the [place]  FDA and has been authorized for detection and/or diagnosis of SARS-CoV-2 by FDA under an Emergency Use Authorization (EUA). This EUA will remain in effect (meaning this test can be used) for the duration of the COVID-19 declaration under Section 564(b)(1) of the Act, 21 U.S.C. section 360bbb-3(b)(1), unless the authorization is terminated or revoked.     Resp Syncytial  Virus by PCR NEGATIVE NEGATIVE Final    Comment: (NOTE) Fact Sheet for Patients: BloggerCourse.com  Fact Sheet for Healthcare Providers: SeriousBroker.it  This test is not yet approved or cleared by the [place]  FDA and has been authorized for detection and/or diagnosis of SARS-CoV-2 by FDA under an Emergency Use Authorization (EUA). This EUA will remain in effect (meaning this test can be used) for the duration of the COVID-19 declaration under Section 564(b)(1) of the Act, 21 U.S.C. section 360bbb-3(b)(1), unless the authorization is terminated or revoked.  Performed at [facility], [address], [place], [place] [address]          Radiology Studies: DG Chest 1 View Result Date:  08/07/2024 CLINICAL DATA:  Shortness of breath. EXAM: CHEST  1 VIEW COMPARISON:  None Available. FINDINGS: The heart size and mediastinal contours are within normal limits. Both lungs are clear. The visualized skeletal structures are unremarkable. IMPRESSION: No active disease. Electronically Signed   By: Lynwood Landy Raddle M.D.   On: 08/07/2024 14:09        Scheduled Meds:  cyanocobalamin  1,000 mcg Oral Daily   enoxaparin (LOVENOX) injection  30 mg Subcutaneous QHS   ezetimibe  10 mg Oral Daily   ferrous sulfate  325 mg Oral QODAY   insulin aspart  0-9 Units Subcutaneous TID WC   levothyroxine  88 mcg Oral Q0600   rosuvastatin  5 mg Oral QODAY   Continuous Infusions:  sodium chloride 75 mL/hr at 08/08/24 0908     LOS: 0 days     Devaughn KATHEE Ban, MD [facility]   If 7PM-7AM, please contact night-coverage [email or web address] Password [email or web address] 08/08/2024, 4:08 PM

## 2024-08-08 NOTE — Progress Notes (Signed)
 Brief Assessment Note  Medical record reviewed and patient has no TOC needs at this time. Please outreach to [facility] if needs are identified.

## 2024-08-08 NOTE — Plan of Care (Signed)
  Problem: Education: Goal: Knowledge of General Education information will improve Description: Including pain rating scale, medication(s)/side effects and non-pharmacologic comfort measures Outcome: Progressing   Problem: Health Behavior/Discharge Planning: Goal: Ability to manage health-related needs will improve Outcome: Progressing   Problem: Clinical Measurements: Goal: Ability to maintain clinical measurements within normal limits will improve Outcome: Progressing Goal: Will remain free from infection Outcome: Progressing Goal: Diagnostic test results will improve Outcome: Progressing Goal: Respiratory complications will improve Outcome: Progressing Goal: Cardiovascular complication will be avoided Outcome: Progressing   Problem: Nutrition: Goal: Adequate nutrition will be maintained Outcome: Progressing   Problem: Coping: Goal: Level of anxiety will decrease Outcome: Progressing   Problem: Elimination: Goal: Will not experience complications related to bowel motility Outcome: Progressing Goal: Will not experience complications related to urinary retention Outcome: Progressing   Problem: Pain Managment: Goal: General experience of comfort will improve and/or be controlled Outcome: Progressing   Problem: Education: Goal: Ability to describe self-care measures that may prevent or decrease complications (Diabetes Survival Skills Education) will improve Outcome: Progressing Goal: Individualized Educational Video(s) Outcome: Progressing   Problem: Skin Integrity: Goal: Risk for impaired skin integrity will decrease Outcome: Progressing   Problem: Coping: Goal: Ability to adjust to condition or change in health will improve Outcome: Progressing   Problem: Metabolic: Goal: Ability to maintain appropriate glucose levels will improve Outcome: Progressing   Problem: Health Behavior/Discharge Planning: Goal: Ability to identify and utilize available resources and  services will improve Outcome: Progressing Goal: Ability to manage health-related needs will improve Outcome: Progressing   Problem: Nutritional: Goal: Maintenance of adequate nutrition will improve Outcome: Progressing Goal: Progress toward achieving an optimal weight will improve Outcome: Progressing

## 2024-08-08 NOTE — Care Management Obs Status (Signed)
 MEDICARE OBSERVATION STATUS NOTIFICATION   Patient Details  Name: Sheena [name] MRN: [id number] Date of Birth: [birth year]   Medicare Observation Status Notification Given:  Yes    Miriam Kestler W, CMA 08/08/2024, 11:08 AM

## 2024-08-09 DIAGNOSIS — N179 Acute kidney failure, unspecified: Secondary | ICD-10-CM | POA: Diagnosis not present

## 2024-08-09 LAB — BASIC METABOLIC PANEL WITH GFR
Anion gap: 4 — ABNORMAL LOW (ref 5–15)
BUN: 38 mg/dL — ABNORMAL HIGH (ref 8–23)
CO2: 23 mmol/L (ref 22–32)
Calcium: 7.9 mg/dL — ABNORMAL LOW (ref 8.9–10.3)
Chloride: 109 mmol/L (ref 98–111)
Creatinine, Ser: 1.36 mg/dL — ABNORMAL HIGH (ref 0.44–1.00)
GFR, Estimated: 39 mL/min — ABNORMAL LOW (ref >60)
Glucose, Bld: 143 mg/dL — ABNORMAL HIGH (ref 70–99)
Potassium: 3.9 mmol/L (ref 3.5–5.1)
Sodium: 136 mmol/L (ref 135–145)

## 2024-08-09 LAB — GLUCOSE, CAPILLARY
Glucose-Capillary: 161 mg/dL — ABNORMAL HIGH (ref 70–99)
Glucose-Capillary: 176 mg/dL — ABNORMAL HIGH (ref 70–99)

## 2024-08-09 NOTE — Discharge Summary (Signed)
 Sheena  A [name] FMW:[id number] DOB: Mar 22, 1941 DOA: 08/07/2024  PCP: Alla Amis, MD  Admit date: 08/07/2024 Discharge date: 08/09/2024  Time spent: 35 minutes  Recommendations for Outpatient Follow-up:  Pcp f/u 1 week, attention to kidney function, blood pressure, and glycemic control at that visit     Discharge Diagnoses:  Principal Problem:   AKI (acute kidney injury) Active Problems:   CKD (chronic kidney disease) stage 3, GFR 30-59 ml/min (HCC)   Essential hypertension   Type 2 diabetes mellitus with stage 3 chronic kidney disease, without long-term current use of insulin (HCC)   Discharge Condition: improved  Diet recommendation: carb modified heart healthy  Filed Weights   08/07/24 1149 08/07/24 1647  Weight: 67.1 kg 69.4 kg    History of present illness:  From admission h and p Sheena  A [name] is a 83 y.o. female with medical history significant of hypertension, type 2 diabetes, iron deficiency anemia, recent UTI completed course of Bactrim last week who presented to the ED on referral from PCP today for evaluation of generalized weakness and abnormal labs.  PCP note from yesterday reviewed, patient was seen for generalized weakness and night sweats.  She had been treated last week with Bactrim for UTI.  That urine culture grew E. coli.  Patient had been having dysuria which had resolved.  Since that time, patient notes progressive generalized weakness to the point of her legs giving out leading to a fall in her kitchen.  She denies any injuries from that.  No loss of consciousness.  Patient also reports diminished appetite, and had nausea vomiting and diarrhea while she was on antibiotic.  She took Imodium today after diarrhea all night last night.  She reports seems to be making less urine since completing the antibiotic but dysuria completely resolved.  Patient also notes dizziness when she gets up moving around and muscle weakness but no myalgias.  She denies  fevers or chills.  No abdominal pain, cough, congestion, sore throat, headaches, one-sided weakness numbness or tingling.   Hospital Course:  Patient with recent UTI treated with bactrim, developed nausea/vomiting/diarrhea shortly after starting that medication, here found to be hypotensive with AKI likely 2/2 to that volume depletion with home antihypertensives likely contributing. Here patient was treated with IV fluids and kidney function much improved, close to baseline. Patient is tolerating PO, has had no vomiting or diarrhea here. Ambulated satisfactorily with PT, home health advised but patient declined. BP well controlled off home meds, advise holding them pending f/u with PCP. A1c is 5.9% and patient reports episodes of hypoglycemia overnight that started prior to her acute illness, thus advise holding home glipizide pending pcp f/u, probably worth trialing her off that med. No uti symptoms and ua appears benign.   Procedures: none   Consultations: none  Discharge Exam: Vitals:   08/09/24 0421 08/09/24 0839  BP: (!) 108/55 107/69  Pulse: 76 72  Resp: 16 17  Temp: 98.4 F (36.9 C) 98.4 F (36.9 C)  SpO2: 98% 100%    General: NAD Cardiovascular: RRR Respiratory: CTAB  Discharge Instructions   Discharge Instructions     Diet - low sodium heart healthy   Complete by: As directed    Increase activity slowly   Complete by: As directed       Allergies as of 08/09/2024       Reactions   Bactrim [sulfamethoxazole-trimethoprim] Other (See Comments)   Nausea, vomiting, diarrhea, dehydration   Neosporin Original [neomycin-bacitracin Zn-polymyx] Itching   Penicillins  Other reaction(s): Other (See Comments) Precaution b/c family members are allergic   Pravastatin Other (See Comments)   arthralgia        Medication List     PAUSE taking these medications    benazepril 20 MG tablet Wait to take this until your doctor or other care provider tells you to start  again. Commonly known as: LOTENSIN Take 1 tablet by mouth daily.   glipiZIDE 5 MG tablet Wait to take this until your doctor or other care provider tells you to start again. Commonly known as: GLUCOTROL Take 5 mg by mouth 2 (two) times daily before a meal.   hydrochlorothiazide 25 MG tablet Wait to take this until your doctor or other care provider tells you to start again. Commonly known as: HYDRODIURIL Take 1 tablet by mouth daily.       TAKE these medications    alendronate 70 MG tablet Commonly known as: FOSAMAX Take 70 mg by mouth once a week.   Calcium Carb-Cholecalciferol 600-10 MG-MCG Tabs Take by mouth.   cyanocobalamin 1000 MCG tablet Commonly known as: VITAMIN B12 Take 1,000 mcg by mouth daily.   ezetimibe 10 MG tablet Commonly known as: ZETIA Take 10 mg by mouth daily.   ferrous sulfate 325 (65 FE) MG tablet Take 325 mg by mouth every other day.   Fifty50 Glucose Meter 2.0 w/Device Kit Use as directed Dx E11.22 One Touch   levothyroxine 88 MCG tablet Commonly known as: SYNTHROID TAKE 1 TABLET BY MOUTH ONCE DAILY ON AN EMPTY STOMACH WITH  A  GLASS  OF  WATER  AT  LEAST  30  TO  60  MINUTES  BEFORE  BREAKFAST   metFORMIN 500 MG 24 hr tablet Commonly known as: GLUCOPHAGE-XR Take 500 mg by mouth 2 (two) times daily with a meal.   Multi-Vitamins Tabs Take by mouth.   OneTouch Delica Plus Lancet33G Misc USE TO CHECK FASTING BLOOD SUGAR ONCE DAILY   rosuvastatin 5 MG tablet Commonly known as: CRESTOR Take 5 mg by mouth every other day.       Allergies  Allergen Reactions   Bactrim [Sulfamethoxazole-Trimethoprim] Other (See Comments)    Nausea, vomiting, diarrhea, dehydration   Neosporin Original [Neomycin-Bacitracin Zn-Polymyx] Itching   Penicillins     Other reaction(s): Other (See Comments) Precaution b/c family members are allergic   Pravastatin Other (See Comments)    arthralgia    Follow-up Information     Alla Amis, MD  Follow up.   Specialty: Family Medicine Why: in one week Contact information: [address] [facility] [place] [address] [phone]                  The results of significant diagnostics from this hospitalization (including imaging, microbiology, ancillary and laboratory) are listed below for reference.    Significant Diagnostic Studies: DG Chest 1 View Result Date: 08/07/2024 CLINICAL DATA:  Shortness of breath. EXAM: CHEST  1 VIEW COMPARISON:  None Available. FINDINGS: The heart size and mediastinal contours are within normal limits. Both lungs are clear. The visualized skeletal structures are unremarkable. IMPRESSION: No active disease. Electronically Signed   By: Lynwood Landy Raddle M.D.   On: 08/07/2024 14:09    Microbiology: Recent Results (from the past 240 hours)  Resp panel by RT-PCR (RSV, Flu A&B, Covid) Anterior Nasal Swab     Status: None   Collection Time: 08/07/24  1:07 PM   Specimen: Anterior Nasal Swab  Result Value  Ref Range Status   SARS Coronavirus 2 by RT PCR NEGATIVE NEGATIVE Final    Comment: (NOTE) SARS-CoV-2 target nucleic acids are NOT DETECTED.  The SARS-CoV-2 RNA is generally detectable in upper respiratory specimens during the acute phase of infection. The lowest concentration of SARS-CoV-2 viral copies this assay can detect is 138 copies/mL. A negative result does not preclude SARS-Cov-2 infection and should not be used as the sole basis for treatment or other patient management decisions. A negative result may occur with  improper specimen collection/handling, submission of specimen other than nasopharyngeal swab, presence of viral mutation(s) within the areas targeted by this assay, and inadequate number of viral copies(<138 copies/mL). A negative result must be combined with clinical observations, patient history, and epidemiological information. The expected result is Negative.  Fact Sheet for Patients:   BloggerCourse.com  Fact Sheet for Healthcare Providers:  SeriousBroker.it  This test is no t yet approved or cleared by the [place]  FDA and  has been authorized for detection and/or diagnosis of SARS-CoV-2 by FDA under an Emergency Use Authorization (EUA). This EUA will remain  in effect (meaning this test can be used) for the duration of the COVID-19 declaration under Section 564(b)(1) of the Act, 21 U.S.C.section 360bbb-3(b)(1), unless the authorization is terminated  or revoked sooner.       Influenza A by PCR NEGATIVE NEGATIVE Final   Influenza B by PCR NEGATIVE NEGATIVE Final    Comment: (NOTE) The Xpert Xpress SARS-CoV-2/FLU/RSV plus assay is intended as an aid in the diagnosis of influenza from Nasopharyngeal swab specimens and should not be used as a sole basis for treatment. Nasal washings and aspirates are unacceptable for Xpert Xpress SARS-CoV-2/FLU/RSV testing.  Fact Sheet for Patients: BloggerCourse.com  Fact Sheet for Healthcare Providers: SeriousBroker.it  This test is not yet approved or cleared by the [place]  FDA and has been authorized for detection and/or diagnosis of SARS-CoV-2 by FDA under an Emergency Use Authorization (EUA). This EUA will remain in effect (meaning this test can be used) for the duration of the COVID-19 declaration under Section 564(b)(1) of the Act, 21 U.S.C. section 360bbb-3(b)(1), unless the authorization is terminated or revoked.     Resp Syncytial Virus by PCR NEGATIVE NEGATIVE Final    Comment: (NOTE) Fact Sheet for Patients: BloggerCourse.com  Fact Sheet for Healthcare Providers: SeriousBroker.it  This test is not yet approved or cleared by the [place]  FDA and has been authorized for detection and/or diagnosis of SARS-CoV-2 by FDA under an Emergency Use  Authorization (EUA). This EUA will remain in effect (meaning this test can be used) for the duration of the COVID-19 declaration under Section 564(b)(1) of the Act, 21 U.S.C. section 360bbb-3(b)(1), unless the authorization is terminated or revoked.  Performed at [facility], [address] [place], [place] [address]      Labs: Basic Metabolic Panel: Recent Labs  Lab 08/07/24 1153 08/07/24 1545 08/08/24 0427 08/09/24 0518  NA 131*  --  136 136  K 3.3*  --  3.8 3.9  CL 97*  --  105 109  CO2 22  --  23 23  GLUCOSE 131*  --  77 143*  BUN 68*  --  52* 38*  CREATININE 2.11*  --  1.77* 1.36*  CALCIUM 8.4*  --  7.9* 7.9*  MG  --  1.8  --   --    Liver Function Tests: Recent Labs  Lab 08/07/24 1153  AST 59*  ALT 62*  ALKPHOS  135*  BILITOT 0.7  PROT 7.2  ALBUMIN 3.5   No results for input(s): LIPASE, AMYLASE in the last 168 hours. No results for input(s): AMMONIA in the last 168 hours. CBC: Recent Labs  Lab 08/07/24 1153  WBC 9.1  NEUTROABS 7.3  HGB 11.3*  HCT 33.6*  MCV 89.4  PLT 251   Cardiac Enzymes: Recent Labs  Lab 08/07/24 1153  CKTOTAL 66   BNP: BNP (last 3 results) No results for input(s): BNP in the last 8760 hours.  ProBNP (last 3 results) No results for input(s): PROBNP in the last 8760 hours.  CBG: Recent Labs  Lab 08/08/24 0817 08/08/24 1238 08/08/24 1716 08/08/24 2006 08/09/24 0906  GLUCAP 76 142* 151* 136* 161*       Signed:  Devaughn KATHEE Ban MD.  [facility] 08/09/2024, 12:01 PM

## 2024-08-09 NOTE — Plan of Care (Signed)
  Problem: Clinical Measurements: Goal: Ability to maintain clinical measurements within normal limits will improve Outcome: Progressing   Problem: Activity: Goal: Risk for activity intolerance will decrease Outcome: Progressing   Problem: Elimination: Goal: Will not experience complications related to urinary retention Outcome: Progressing   Problem: Skin Integrity: Goal: Risk for impaired skin integrity will decrease Outcome: Progressing

## 2024-08-09 NOTE — Evaluation (Signed)
 Physical Therapy Evaluation Patient Details Name: Sheena [name] MRN: [id number] DOB: 21-Aug-1941 Today's Date: 08/09/2024  History of Present Illness  Pt is an 83 y/o F presenting to ED with c/o weakness, dizziness, congestion, diarrhea, also sustained a fall at home. MD assessment includes AKI, hypokalemia, hyponatremia. PMH signifcant for T2DM, hypercholesterolemia, anemia, recent UTI.   Clinical Impression  Pt A&Ox4, agreeable to participate in PT evaluation. At baseline, pt is mostly IND with mobility, cites intermittent SPC use when her knees hurt, IND with ADLs. Pt was met semi-supine in bed, supervision for bed mobility. Pt performed multiple STS from various surfaces with CGA and min VC for hand placement on RW. Pt able to amb to bathroom and around room with RW, no LOB or assistance required. Pt able to stand for brief management and to wash hands at sink with steady balance. Pt receptive to education on use of RW at home for safe mobility and to decrease risk of falls. Pt initially on 2L [place] with SpO2 100% at rest, pt removed supplemental O2 during session, SpO2 remained >96% on RA. Pt was left seated in recliner at end of session, all needs in reach, left on RA per pt request- RN informed. Pt would benefit from skilled PT intervention to address listed deficits (see PT Problem List) and allow for safe return to PLOF.       If plan is discharge home, recommend the following: A little help with walking and/or transfers;A little help with bathing/dressing/bathroom;Assist for transportation;Help with stairs or ramp for entrance   Can travel by private vehicle        Equipment Recommendations None recommended by PT (pt has recommended DME)  Recommendations for Other Services       Functional Status Assessment Patient has had a recent decline in their functional status and demonstrates the ability to make significant improvements in function in a reasonable and predictable amount of  time.     Precautions / Restrictions Precautions Precautions: Fall Recall of Precautions/Restrictions: Intact Restrictions Weight Bearing Restrictions Per Provider Order: No      Mobility  Bed Mobility Overal bed mobility: Needs Assistance Bed Mobility: Supine to Sit     Supine to sit: Supervision, HOB elevated, Used rails     General bed mobility comments: no physical assistance required    Transfers Overall transfer level: Needs assistance Equipment used: Rolling walker (2 wheels) Transfers: Sit to/from Stand Sit to Stand: Contact guard assist           General transfer comment: STS from EOB x1 and from toilet with CGA, VC for hand placement    Ambulation/Gait Ambulation/Gait assistance: Contact guard assist Gait Distance (Feet): 24 Feet Assistive device: Rolling walker (2 wheels) Gait Pattern/deviations: Step-through pattern, Narrow base of support Gait velocity: decreased     General Gait Details: no LOB or reports of dizziness, mild decreased cadence, self-limited distance  Stairs            Wheelchair Mobility     Tilt Bed    Modified Rankin (Stroke Patients Only)       Balance Overall balance assessment: Needs assistance Sitting-balance support: Feet supported Sitting balance-Leahy Scale: Good Sitting balance - Comments: steady static and dynamic sitting   Standing balance support: Bilateral upper extremity supported, No upper extremity supported Standing balance-Leahy Scale: Good Standing balance comment: able to stand for brief management and stand at sink to wash hands with steady balance  Pertinent Vitals/Pain Pain Assessment Pain Assessment: No/denies pain    Home Living Family/patient expects to be discharged to:: Private residence Living Arrangements: Spouse/significant other Available Help at Discharge: Family;Available PRN/intermittently Type of Home: House Home Access: Stairs to  enter Entrance Stairs-Rails: None (has posts she can hold onto) Secretary/administrator of Steps: 3 Alternate Level Stairs-Number of Steps: flight Home Layout: Two level;Able to live on main level with bedroom/bathroom (pt does not go upstairs often) Home Equipment: Grab bars - toilet;Grab bars - tub/shower;Rolling Walker (2 wheels);Cane - single point Additional Comments: pt states that her daughter lives next door to assist    Prior Function Prior Level of Function : Independent/Modified Independent;History of Falls (last six months)             Mobility Comments: Pt reports intermittent SPC use for amb when her knees hurt, had a fall before coming to ED. ADLs Comments: IND with ADLs     Extremity/Trunk Assessment   Upper Extremity Assessment Upper Extremity Assessment: Overall WFL for tasks assessed    Lower Extremity Assessment Lower Extremity Assessment: Generalized weakness       Communication   Communication Communication: Impaired Factors Affecting Communication: Hearing impaired    Cognition Arousal: Alert Behavior During Therapy: WFL for tasks assessed/performed   PT - Cognitive impairments: No apparent impairments                       PT - Cognition Comments: A&Ox4 Following commands: Intact       Cueing Cueing Techniques: Verbal cues, Visual cues     General Comments      Exercises Other Exercises Other Exercises: Pt initially on 2L [place] at start of session, SpO2 100%, pt removed O2 to use bathroom and amb to chair, SpO2 >96% throughout on room air. HR in mid 80s after bouts of ambulation. Pt left on RA per pt request   Assessment/Plan    PT Assessment Patient needs continued PT services  PT Problem List Decreased strength;Decreased activity tolerance;Decreased balance;Decreased mobility       PT Treatment Interventions DME instruction;Gait training;Stair training;Functional mobility training;Therapeutic activities;Therapeutic  exercise;Balance training;Neuromuscular re-education;Patient/family education    PT Goals (Current goals can be found in the Care Plan section)  Acute Rehab PT Goals Patient Stated Goal: to go home PT Goal Formulation: With patient Time For Goal Achievement: 08/23/24 Potential to Achieve Goals: Good    Frequency Min 2X/week     Co-evaluation               AM-PAC PT 6 Clicks Mobility  Outcome Measure Help needed turning from your back to your side while in a flat bed without using bedrails?: None Help needed moving from lying on your back to sitting on the side of a flat bed without using bedrails?: A Little Help needed moving to and from a bed to a chair (including a wheelchair)?: A Little Help needed standing up from a chair using your arms (e.g., wheelchair or bedside chair)?: A Little Help needed to walk in hospital room?: A Little Help needed climbing 3-5 steps with a railing? : A Little 6 Click Score: 19    End of Session Equipment Utilized During Treatment: Gait belt Activity Tolerance: Patient tolerated treatment well Patient left: in chair;with call bell/phone within reach;with chair alarm set Nurse Communication: Mobility status;Other (comment) (pt left on RA) PT Visit Diagnosis: Other abnormalities of gait and mobility (R26.89);Muscle weakness (generalized) (M62.81);History of falling (Z91.81)  Time: 9090-9063 PT Time Calculation (min) (ACUTE ONLY): 27 min   Charges:   PT Evaluation $PT Eval Moderate Complexity: 1 Mod   PT General Charges $$ ACUTE PT VISIT: 1 Visit         Daren Doswell, SPT

## 2024-08-09 NOTE — TOC Transition Note (Signed)
 Transition of Care [facility]) - Discharge Note   Patient Details  Name: Sheena [name] MRN: [id number] Date of Birth: 1941-04-19  Transition of Care [facility]) CM/SW Contact:  Dalia GORMAN Fuse, RN Phone Number: 08/09/2024, 12:42 PM   Clinical Narrative:     Patient is medically clear to discharge to home. Therapy recs are for [facility] PT, but the patient declined. No other TOC needs.  Final next level of care: Home/Self Care Barriers to Discharge: Barriers Resolved   Patient Goals and CMS Choice            Discharge Placement                       Discharge Plan and Services Additional resources added to the After Visit Summary for                                       Social Drivers of Health (SDOH) Interventions SDOH Screenings   Food Insecurity: No Food Insecurity (08/07/2024)  Housing: Unknown (08/07/2024)  Transportation Needs: No Transportation Needs (08/07/2024)  Utilities: Not At Risk (08/07/2024)  Financial Resource Strain: Low Risk  (08/08/2023)   Received from [facility]  Social Connections: Unknown (08/07/2024)  Tobacco Use: Low Risk  (08/07/2024)     Readmission Risk Interventions     No data to display

## 2024-08-13 DIAGNOSIS — Z87440 Personal history of urinary (tract) infections: Secondary | ICD-10-CM | POA: Diagnosis not present

## 2024-08-13 DIAGNOSIS — E861 Hypovolemia: Secondary | ICD-10-CM | POA: Diagnosis not present

## 2024-08-13 DIAGNOSIS — N179 Acute kidney failure, unspecified: Secondary | ICD-10-CM | POA: Diagnosis not present

## 2024-08-21 DIAGNOSIS — E1122 Type 2 diabetes mellitus with diabetic chronic kidney disease: Secondary | ICD-10-CM | POA: Diagnosis not present

## 2024-08-21 DIAGNOSIS — E7800 Pure hypercholesterolemia, unspecified: Secondary | ICD-10-CM | POA: Diagnosis not present

## 2024-08-21 DIAGNOSIS — E039 Hypothyroidism, unspecified: Secondary | ICD-10-CM | POA: Diagnosis not present

## 2024-08-21 DIAGNOSIS — Z87440 Personal history of urinary (tract) infections: Secondary | ICD-10-CM | POA: Diagnosis not present

## 2024-08-21 DIAGNOSIS — R55 Syncope and collapse: Secondary | ICD-10-CM | POA: Diagnosis not present

## 2024-08-21 DIAGNOSIS — Z0000 Encounter for general adult medical examination without abnormal findings: Secondary | ICD-10-CM | POA: Diagnosis not present

## 2024-08-21 DIAGNOSIS — D508 Other iron deficiency anemias: Secondary | ICD-10-CM | POA: Diagnosis not present

## 2024-08-21 DIAGNOSIS — N1831 Chronic kidney disease, stage 3a: Secondary | ICD-10-CM | POA: Diagnosis not present

## 2024-08-24 DIAGNOSIS — E7800 Pure hypercholesterolemia, unspecified: Secondary | ICD-10-CM | POA: Diagnosis not present

## 2024-09-06 DIAGNOSIS — H35371 Puckering of macula, right eye: Secondary | ICD-10-CM | POA: Diagnosis not present

## 2024-09-06 DIAGNOSIS — H43813 Vitreous degeneration, bilateral: Secondary | ICD-10-CM | POA: Diagnosis not present

## 2024-09-06 DIAGNOSIS — H353132 Nonexudative age-related macular degeneration, bilateral, intermediate dry stage: Secondary | ICD-10-CM | POA: Diagnosis not present

## 2024-09-06 DIAGNOSIS — E119 Type 2 diabetes mellitus without complications: Secondary | ICD-10-CM | POA: Diagnosis not present
